# Patient Record
Sex: Male | Born: 1950 | Race: White | Hispanic: No | Marital: Married | Smoking: Former smoker
Health system: Southern US, Community
[De-identification: ages and names within clinical notes are randomized; demographics above are authoritative.]

## PROBLEM LIST (undated history)

## (undated) DIAGNOSIS — J45909 Unspecified asthma, uncomplicated: Secondary | ICD-10-CM

## (undated) DIAGNOSIS — I10 Essential (primary) hypertension: Secondary | ICD-10-CM

## (undated) DIAGNOSIS — E119 Type 2 diabetes mellitus without complications: Secondary | ICD-10-CM

## (undated) DIAGNOSIS — F419 Anxiety disorder, unspecified: Secondary | ICD-10-CM

## (undated) DIAGNOSIS — G4730 Sleep apnea, unspecified: Secondary | ICD-10-CM

## (undated) DIAGNOSIS — L409 Psoriasis, unspecified: Secondary | ICD-10-CM

## (undated) DIAGNOSIS — S0291XA Unspecified fracture of skull, initial encounter for closed fracture: Secondary | ICD-10-CM

## (undated) DIAGNOSIS — E7800 Pure hypercholesterolemia, unspecified: Secondary | ICD-10-CM

## (undated) HISTORY — PX: NOSE SURGERY: SHX723

## (undated) HISTORY — DX: Anxiety disorder, unspecified: F41.9

## (undated) HISTORY — DX: Sleep apnea, unspecified: G47.30

## (undated) HISTORY — PX: ADENOIDECTOMY: SUR15

## (undated) HISTORY — DX: Essential (primary) hypertension: I10

## (undated) HISTORY — DX: Unspecified asthma, uncomplicated: J45.909

## (undated) HISTORY — DX: Type 2 diabetes mellitus without complications: E11.9

## (undated) HISTORY — DX: Pure hypercholesterolemia, unspecified: E78.00

---

## 1968-06-13 DIAGNOSIS — S0291XA Unspecified fracture of skull, initial encounter for closed fracture: Secondary | ICD-10-CM

## 1968-06-13 HISTORY — DX: Unspecified fracture of skull, initial encounter for closed fracture: S02.91XA

## 2008-10-03 ENCOUNTER — Ambulatory Visit: Payer: Self-pay | Admitting: Unknown Physician Specialty

## 2008-10-05 ENCOUNTER — Emergency Department: Payer: Self-pay | Admitting: Emergency Medicine

## 2013-11-20 DIAGNOSIS — F411 Generalized anxiety disorder: Secondary | ICD-10-CM | POA: Insufficient documentation

## 2013-11-20 DIAGNOSIS — E349 Endocrine disorder, unspecified: Secondary | ICD-10-CM | POA: Insufficient documentation

## 2014-07-25 ENCOUNTER — Encounter: Payer: Self-pay | Admitting: Urology

## 2014-07-30 ENCOUNTER — Encounter: Payer: Self-pay | Admitting: Urology

## 2014-08-05 ENCOUNTER — Ambulatory Visit: Payer: Self-pay | Admitting: Family Medicine

## 2014-08-12 ENCOUNTER — Encounter
Admit: 2014-08-12 | Disposition: A | Discharge status: Home / Self Care | Payer: Self-pay | Attending: Urology | Admitting: Urology

## 2014-11-26 DIAGNOSIS — L409 Psoriasis, unspecified: Secondary | ICD-10-CM | POA: Insufficient documentation

## 2014-11-26 DIAGNOSIS — E669 Obesity, unspecified: Secondary | ICD-10-CM | POA: Insufficient documentation

## 2014-11-26 DIAGNOSIS — G4739 Other sleep apnea: Secondary | ICD-10-CM | POA: Insufficient documentation

## 2015-01-14 ENCOUNTER — Ambulatory Visit
Admission: RE | Admit: 2015-01-14 | Discharge: 2015-01-14 | Disposition: A | Discharge status: Home / Self Care | Payer: BC Managed Care – PPO | Source: Ambulatory Visit | Attending: Urology | Admitting: Urology

## 2015-01-14 DIAGNOSIS — D751 Secondary polycythemia: Secondary | ICD-10-CM | POA: Insufficient documentation

## 2015-01-14 LAB — CBC
HCT: 56.4 % — ABNORMAL HIGH (ref 40.0–52.0)
HEMOGLOBIN: 19.3 g/dL — AB (ref 13.0–18.0)
MCH: 31.8 pg (ref 26.0–34.0)
MCHC: 34.1 g/dL (ref 32.0–36.0)
MCV: 93 fL (ref 80.0–100.0)
PLATELETS: 206 10*3/uL (ref 150–440)
RBC: 6.07 MIL/uL — AB (ref 4.40–5.90)
RDW: 12.9 % (ref 11.5–14.5)
WBC: 7.4 10*3/uL (ref 3.8–10.6)

## 2015-07-06 DIAGNOSIS — N529 Male erectile dysfunction, unspecified: Secondary | ICD-10-CM | POA: Insufficient documentation

## 2015-07-06 DIAGNOSIS — N138 Other obstructive and reflux uropathy: Secondary | ICD-10-CM | POA: Insufficient documentation

## 2015-07-06 DIAGNOSIS — R351 Nocturia: Secondary | ICD-10-CM | POA: Insufficient documentation

## 2015-07-06 DIAGNOSIS — N401 Enlarged prostate with lower urinary tract symptoms: Secondary | ICD-10-CM

## 2017-09-27 ENCOUNTER — Encounter: Payer: Self-pay | Admitting: Urology

## 2017-09-27 ENCOUNTER — Ambulatory Visit: Payer: Medicare Other | Admitting: Urology

## 2017-09-27 VITALS — BP 164/98 | HR 90 | Resp 16 | Ht 66.0 in | Wt 208.9 lb

## 2017-09-27 DIAGNOSIS — N5201 Erectile dysfunction due to arterial insufficiency: Secondary | ICD-10-CM

## 2017-09-27 DIAGNOSIS — E291 Testicular hypofunction: Secondary | ICD-10-CM

## 2017-09-27 MED ORDER — SILDENAFIL CITRATE 20 MG PO TABS: ORAL_TABLET | ORAL | 3 refills | Status: AC

## 2017-09-27 NOTE — Progress Notes (Signed)
09/27/2017 7:10 AM   Satira Sark Nov 24, 1950 [id number]  Referring provider: Dorothey Baseman, MD [phone] [name] [place], [place] [address]  Chief Complaint  Patient presents with  . Follow-up    HPI: Roberto Christian is a 67 year old male followed for hypogonadism, erectile dysfunction and lower urinary tract symptoms.  He was previously followed by Dr. Sheppard Penton.  He underwent TUMT without improvement in his voiding symptoms.  He was interested in PVP however a video urodynamic study was performed which showed no evidence of outlet obstruction and decreased detrusor tone.  He did not have a significant PVR and his voiding symptoms actually improved.  He is using sildenafil for erectile dysfunction.  He has symptomatic hypogonadism with tiredness, fatigue and decreased libido and is injecting 200 mg every 2 weeks however he ran out of testosterone 2 months ago and has been off TRT.   PMH: Past Medical History:  Diagnosis Date  . Anxiety   . Asthma   . High cholesterol   . Hypertension   . Sleep apnea     Surgical History: Past Surgical History:  Procedure Laterality Date  . ADENOIDECTOMY    . NOSE SURGERY      Home Medications:  Allergies as of 09/27/2017   No Known Allergies     Medication List        Accurate as of 09/27/17 11:59 PM. Always use your most recent med list.          fluticasone 50 MCG/ACT nasal spray Commonly known as:  FLONASE Place into the nose.   sildenafil 20 MG tablet Commonly known as:  REVATIO 1-5 tabs 1 hour prior to intercourse   triamcinolone cream 0.1 % Commonly known as:  KENALOG Apply topically.   trospium 20 MG tablet Commonly known as:  SANCTURA Take by mouth.       Allergies: No Known Allergies  Family History: Family History  Problem Relation Age of Onset  . Cancer Mother 94  . Heart attack Father 75  . Bladder Cancer Neg Hx   . Kidney cancer Neg Hx     Social History:  has no tobacco, alcohol, and drug history on  file.  ROS: UROLOGY Frequent Urination?: No Hard to postpone urination?: No Burning/pain with urination?: No Get up at night to urinate?: Yes Leakage of urine?: No Urine stream starts and stops?: No Trouble starting stream?: No Do you have to strain to urinate?: No Blood in urine?: No Urinary tract infection?: No Sexually transmitted disease?: No Injury to kidneys or bladder?: No Painful intercourse?: No Weak stream?: No Erection problems?: Yes Penile pain?: No  Gastrointestinal Nausea?: No Vomiting?: No Indigestion/heartburn?: No Diarrhea?: No Constipation?: No  Constitutional Fever: No Night sweats?: No Weight loss?: No Fatigue?: No  Skin Skin rash/lesions?: No Itching?: No  Eyes Blurred vision?: No Double vision?: No  Ears/Nose/Throat Sore throat?: No Sinus problems?: Yes  Hematologic/Lymphatic Swollen glands?: No Easy bruising?: No  Cardiovascular Leg swelling?: No Chest pain?: No  Respiratory Cough?: No Shortness of breath?: No  Endocrine Excessive thirst?: No  Musculoskeletal Back pain?: No Joint pain?: No  Neurological Headaches?: No Dizziness?: No  Psychologic Depression?: No Anxiety?: No  Physical Exam: BP (!) 164/98   Pulse 90   Resp 16   Ht 5\' 6"  (1.676 m)   Wt 208 lb 14.4 oz (94.8 kg)   SpO2 98%   BMI 33.72 kg/m   Constitutional:  Alert and oriented, No acute distress. HEENT: [name] AT, moist mucus membranes.  Trachea midline, no masses. Cardiovascular: No clubbing, cyanosis, or edema. Respiratory: Normal respiratory effort, no increased work of breathing. GI: Abdomen is soft, nontender, nondistended, no abdominal masses GU: No CVA tenderness Lymph: No cervical or inguinal lymphadenopathy. Skin: No rashes, bruises or suspicious lesions. Neurologic: Grossly intact, no focal deficits, moving all 4 extremities. Psychiatric: Normal mood and affect.   Assessment & Plan:   67 year old male with hypogonadism and erectile  dysfunction.  Since he has been off replacement for over 2 months labs were not drawn today.  Rx testosterone was sent to his pharmacy along with a refill of sildenafil.  He will return for a lab visit and blood work in approximately 3 months and a follow-up visit here in 6 months.   Return in about 6 months (around 03/29/2018).  [name] C Addy Mcmannis, MD  [facility] [address], [address] [place], [place] [address] [phone]

## 2017-09-28 ENCOUNTER — Encounter: Payer: Self-pay | Admitting: Urology

## 2017-09-28 MED ORDER — TESTOSTERONE CYPIONATE 200 MG/ML IM SOLN: 200.0000 mg | INTRAMUSCULAR | 0 refills | Status: AC

## 2017-11-03 DIAGNOSIS — E785 Hyperlipidemia, unspecified: Secondary | ICD-10-CM | POA: Insufficient documentation

## 2017-11-24 ENCOUNTER — Encounter: Payer: Self-pay | Admitting: *Deleted

## 2017-11-24 ENCOUNTER — Encounter: Payer: Medicare Other | Attending: Family Medicine | Admitting: *Deleted

## 2017-11-24 ENCOUNTER — Ambulatory Visit: Payer: BC Managed Care – PPO | Admitting: *Deleted

## 2017-11-24 VITALS — BP 152/90 | Ht 67.0 in | Wt 208.6 lb

## 2017-11-24 DIAGNOSIS — Z6832 Body mass index (BMI) 32.0-32.9, adult: Secondary | ICD-10-CM | POA: Diagnosis not present

## 2017-11-24 DIAGNOSIS — E119 Type 2 diabetes mellitus without complications: Secondary | ICD-10-CM | POA: Diagnosis not present

## 2017-11-24 DIAGNOSIS — Z713 Dietary counseling and surveillance: Secondary | ICD-10-CM | POA: Insufficient documentation

## 2017-11-24 NOTE — Progress Notes (Signed)
Diabetes Self-Management Education  Visit Type: First/Initial  Appt. Start Time: 1005 Appt. End Time: 1100  The patient stayed until 10:40 am because he had to work. His wife stayed for meal planning instruction.   11/24/2017  [name], identified by name and date of birth, is a 67 y.o. male with a diagnosis of Diabetes: Type 2.   ASSESSMENT  Blood pressure (!) 152/90, height 5\' 7"  (1.702 m), weight 208 lb 9.6 oz (94.6 kg). Body mass index is 32.67 kg/m.  Diabetes Self-Management Education - 11/24/17 1134      Visit Information   Visit Type  First/Initial      Initial Visit   Diabetes Type  Type 2    Are you currently following a meal plan?  No    Are you taking your medications as prescribed?  Yes    Date Diagnosed  Pt reports 1 month - A1C in Jan 2018 of 6.6 %      Health Coping   How would you rate your overall health?  Fair      Psychosocial Assessment   Patient Belief/Attitude about Diabetes  Other (comment) "the same"    Self-care barriers  None    Self-management support  Doctor's office;Family    Other persons present  Spouse/SO Pt came for 40 minutes and had to leave for work. His wife was present for meal planning.     Patient Concerns  Nutrition/Meal planning;Monitoring;Medication;Healthy Lifestyle;Problem Solving;Glycemic Control;Weight Control    Special Needs  None    Preferred Learning Style  Auditory    Learning Readiness  Contemplating    How often do you need to have someone help you when you read instructions, pamphlets, or other written materials from your doctor or pharmacy?  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  Needs Instruction    Patient understands incorporating nutritional management into lifestyle.  Needs Instruction    Patient undertands incorporating physical activity into lifestyle.  Needs  Instruction    Patient understands using medications safely.  Needs Instruction    Patient understands monitoring blood glucose, interpreting and using results  Needs Instruction    Patient understands prevention, detection, and treatment of acute complications.  Needs Instruction    Patient understands prevention, detection, and treatment of chronic complications.  Needs Instruction    Patient understands how to develop strategies to address psychosocial issues.  Needs Instruction    Patient understands how to develop strategies to promote health/change behavior.  Needs Instruction      Complications   Last HgB A1C per patient/outside source  8.5 % 11/03/17    How often do you check your blood sugar?  0 times/day (not testing) Provided One Touch Verio Flex meter and instructed on use. BG upon return demonstration was 234 mg/dL at 40:9810:35 am - 3 hrs pp.     Have you had a dilated eye exam in the past 12 months?  No    Have you had a dental exam in the past 12 months?  Yes    Are you checking your feet?  No      Dietary Intake   Breakfast  eggs and sausage or bacon, oatmeal    Snack (morning)  yogurt, jello and fruit, yougrt bars    Lunch  chicken tenders, potato salad; hamburger  Snack (afternoon)  same as morning    Dinner  grilled chicken, steak, fish, potatoes, peas, corn, rice, pasta, salads, peppers, lettuce, tomatotes, broccoli    Beverage(s)  SF Gatorade      Exercise   Exercise Type  ADL's      Patient Education   Previous Diabetes Education  No    Disease state   Definition of diabetes, type 1 and 2, and the diagnosis of diabetes    Nutrition management   Role of diet in the treatment of diabetes and the relationship between the three main macronutrients and blood glucose level;Reviewed blood glucose goals for pre and post meals and how to evaluate the patients' food intake on their blood glucose level.    Physical activity and exercise   Role of exercise on diabetes management,  blood pressure control and cardiac health.    Medications  Reviewed patients medication for diabetes, action, purpose, timing of dose and side effects.    Monitoring  Taught/evaluated SMBG meter.;Purpose and frequency of SMBG.;Taught/discussed recording of test results and interpretation of SMBG.;Identified appropriate SMBG and/or A1C goals.    Chronic complications  Relationship between chronic complications and blood glucose control    Psychosocial adjustment  Identified and addressed patients feelings and concerns about diabetes      Individualized Goals (developed by patient)   Reducing Risk Improve blood sugars Decrease medications Prevent diabetes complications Lose weight Lead a healthier lifestyle Become more fit     Outcomes   Expected Outcomes  Demonstrated interest in learning. Expect positive outcomes    Future DMSE  4-6 wks       Individualized Plan for Diabetes Self-Management Training:   Learning Objective:  Patient will have a greater understanding of diabetes self-management. Patient education plan is to attend individual and/or group sessions per assessed needs and concerns.   Plan:   Patient Instructions  Check blood sugars 2 x day before breakfast and 2 hrs after supper every day Bring blood sugar records to the next class Call your doctor for a prescription for:  1. Meter strips (type) One Touch Verio checking  2 times per day  2. Lancets (type) One Touch Delica checking  2     times per day  Exercise: Begin walking  for   15  minutes  3 days a week and gradually increase to 30 minutes 5 x week Eat 3 meals day,  1-2  snacks a day Space meals 4-6 hours apart Make an eye doctor appointment  Expected Outcomes:  Demonstrated interest in learning. Expect positive outcomes  Education material provided:  General Meal Planning Guidelines Simple Meal Plan Meter - One Touch Verio Flex  If problems or questions, patient to contact team via:  Sharion Settler,  RN, CCM, CDE [phone]  Future DSME appointment: 4-6 wks  December 21, 2017 for Diabetes Class 1

## 2017-11-24 NOTE — Patient Instructions (Signed)
Check blood sugars 2 x day before breakfast and 2 hrs after supper every day Bring blood sugar records to the next class  Call your doctor for a prescription for:  1. Meter strips (type) One Touch Verio checking  2 times per day  2. Lancets (type) One Touch Delica checking  2     times per day  Exercise: Begin walking  for   15  minutes  3 days a week and gradually increase to 30 minutes 5 x week  Eat 3 meals day,  1-2  snacks a day Space meals 4-6 hours apart  Make an eye doctor appointment  Return for classes on:

## 2017-12-06 ENCOUNTER — Other Ambulatory Visit: Payer: Self-pay | Admitting: Sports Medicine

## 2017-12-06 DIAGNOSIS — M25511 Pain in right shoulder: Principal | ICD-10-CM

## 2017-12-06 DIAGNOSIS — G8929 Other chronic pain: Secondary | ICD-10-CM

## 2017-12-06 DIAGNOSIS — M75101 Unspecified rotator cuff tear or rupture of right shoulder, not specified as traumatic: Secondary | ICD-10-CM

## 2017-12-06 DIAGNOSIS — R29898 Other symptoms and signs involving the musculoskeletal system: Secondary | ICD-10-CM

## 2017-12-15 ENCOUNTER — Ambulatory Visit
Admission: RE | Admit: 2017-12-15 | Discharge: 2017-12-15 | Disposition: A | Discharge status: Home / Self Care | Payer: Medicare Other | Source: Ambulatory Visit | Attending: Sports Medicine | Admitting: Sports Medicine

## 2017-12-15 DIAGNOSIS — M75101 Unspecified rotator cuff tear or rupture of right shoulder, not specified as traumatic: Secondary | ICD-10-CM

## 2017-12-15 DIAGNOSIS — M75121 Complete rotator cuff tear or rupture of right shoulder, not specified as traumatic: Secondary | ICD-10-CM | POA: Insufficient documentation

## 2017-12-15 DIAGNOSIS — M25511 Pain in right shoulder: Secondary | ICD-10-CM | POA: Diagnosis present

## 2017-12-15 DIAGNOSIS — R29898 Other symptoms and signs involving the musculoskeletal system: Secondary | ICD-10-CM | POA: Insufficient documentation

## 2017-12-15 DIAGNOSIS — G8929 Other chronic pain: Secondary | ICD-10-CM | POA: Insufficient documentation

## 2017-12-21 ENCOUNTER — Encounter: Payer: Self-pay | Admitting: Dietician

## 2017-12-21 ENCOUNTER — Encounter: Payer: Medicare Other | Attending: Family Medicine | Admitting: Dietician

## 2017-12-21 VITALS — Ht 67.0 in | Wt 206.7 lb

## 2017-12-21 DIAGNOSIS — E119 Type 2 diabetes mellitus without complications: Secondary | ICD-10-CM | POA: Insufficient documentation

## 2017-12-21 DIAGNOSIS — Z713 Dietary counseling and surveillance: Secondary | ICD-10-CM | POA: Insufficient documentation

## 2017-12-21 DIAGNOSIS — Z6832 Body mass index (BMI) 32.0-32.9, adult: Secondary | ICD-10-CM | POA: Insufficient documentation

## 2017-12-21 NOTE — Progress Notes (Signed)

## 2017-12-27 ENCOUNTER — Other Ambulatory Visit: Payer: Self-pay

## 2017-12-27 ENCOUNTER — Encounter
Admission: RE | Admit: 2017-12-27 | Discharge: 2017-12-27 | Disposition: A | Discharge status: Home / Self Care | Payer: Medicare Other | Source: Ambulatory Visit | Attending: Orthopedic Surgery | Admitting: Orthopedic Surgery

## 2017-12-27 ENCOUNTER — Other Ambulatory Visit: Payer: Medicare Other

## 2017-12-27 DIAGNOSIS — Z01812 Encounter for preprocedural laboratory examination: Secondary | ICD-10-CM | POA: Diagnosis present

## 2017-12-27 DIAGNOSIS — E119 Type 2 diabetes mellitus without complications: Secondary | ICD-10-CM | POA: Diagnosis not present

## 2017-12-27 DIAGNOSIS — I10 Essential (primary) hypertension: Secondary | ICD-10-CM | POA: Insufficient documentation

## 2017-12-27 DIAGNOSIS — Z01810 Encounter for preprocedural cardiovascular examination: Secondary | ICD-10-CM | POA: Insufficient documentation

## 2017-12-27 DIAGNOSIS — E291 Testicular hypofunction: Secondary | ICD-10-CM

## 2017-12-27 HISTORY — DX: Unspecified fracture of skull, initial encounter for closed fracture: S02.91XA

## 2017-12-27 HISTORY — DX: Psoriasis, unspecified: L40.9

## 2017-12-27 LAB — BASIC METABOLIC PANEL
ANION GAP: 9 (ref 5–15)
BUN: 14 mg/dL (ref 8–23)
CO2: 25 mmol/L (ref 22–32)
Calcium: 9.7 mg/dL (ref 8.9–10.3)
Chloride: 105 mmol/L (ref 98–111)
Creatinine, Ser: 0.71 mg/dL (ref 0.61–1.24)
GFR calc Af Amer: >60 mL/min (ref >60)
GFR calc non Af Amer: >60 mL/min (ref >60)
Glucose, Bld: 125 mg/dL — ABNORMAL HIGH (ref 70–99)
POTASSIUM: 3.9 mmol/L (ref 3.5–5.1)
Sodium: 139 mmol/L (ref 135–145)

## 2017-12-27 LAB — CBC
HEMATOCRIT: 53.4 % — AB (ref 40.0–52.0)
Hemoglobin: 18.7 g/dL — ABNORMAL HIGH (ref 13.0–18.0)
MCH: 32.9 pg (ref 26.0–34.0)
MCHC: 35.1 g/dL (ref 32.0–36.0)
MCV: 93.7 fL (ref 80.0–100.0)
Platelets: 223 10*3/uL (ref 150–440)
RBC: 5.7 MIL/uL (ref 4.40–5.90)
RDW: 13 % (ref 11.5–14.5)
WBC: 8.4 10*3/uL (ref 3.8–10.6)

## 2017-12-27 NOTE — Patient Instructions (Signed)
Your procedure is scheduled on: January 05, 2018 FRIDAY [facility] To find out your arrival time, please call [phone] between 1PM - 3PM on: January 04, 2018 Thursday   REMEMBER: Instructions that are not followed completely may result in serious medical risk, up to and including death; or upon the discretion of your surgeon and anesthesiologist your surgery may need to be rescheduled.  Do not eat food after midnight the night before your procedure.  No gum chewing, lozengers or hard candies.  You may however, drink CLEAR liquids up to 2 hours before you are scheduled to arrive for your surgery. Do not drink anything within 2 hours of the start of your surgery.  Clear liquids include: - water   Do NOT drink anything that is not on this list.  Type 1 and Type 2 diabetics should only drink water.  No Alcohol for 24 hours before or after surgery.  No Smoking including e-cigarettes for 24 hours prior to surgery.  No chewable tobacco products for at least 6 hours prior to surgery.  No nicotine patches on the day of surgery.  On the morning of surgery brush your teeth with toothpaste and water, you may rinse your mouth with mouthwash if you wish. Do not swallow any toothpaste or mouthwash.  Notify your doctor if there is any change in your medical condition (cold, fever, infection).  Do not wear jewelry, make-up, hairpins, clips or nail polish.  Do not wear lotions, powders, or perfumes. You may NOTwear deodorant.  Do not shave 48 hours prior to surgery. Men may shave face and neck.  Contacts and dentures may not be worn into surgery.  Do not bring valuables to the hospital, including drivers license, insurance or credit cards.  [facility] is not responsible for any belongings or valuables.   TAKE THESE MEDICATIONS THE MORNING OF SURGERY:  Use CHG Soap or wipes as directed on instruction sheet.  Bring your C-PAP to the hospital  with you in case you may have to spend the night.   Stop Metformin  2 days prior to surgery. LAST DOSE January 02, 2018.  Follow recommendations from Cardiologist, Pulmonologist or PCP regarding stopping Aspirin, Coumadin, Plavix, Eliquis, Pradaxa, or Pletal.  Stop Anti-inflammatories (NSAIDS) such as Advil, Aleve, Ibuprofen, Motrin, Naproxen, Naprosyn and Aspirin based products such as Excedrin, Goodys Powder, BC Powder. (May take Tylenol or Acetaminophen if needed.)  Stop ANY OVER THE COUNTER  MELATONIN  AND supplements until after surgery. (May continue Vitamin D, Vitamin B, and multivitamin.)  Wear comfortable clothing (specific to your surgery type) to the hospital.  Plan for stool softeners for home use.  If you are being admitted to the hospital overnight, leave your suitcase in the car. After surgery it may be brought to your room.  If you are being discharged the day of surgery, you will not be allowed to drive home. You will need a responsible adult to drive you home and stay with you that night.   If you are taking public transportation, you will need to have a responsible adult with you. Please confirm with your physician that it is acceptable to use public transportation.   Please call [phone] if you have any questions about these instructions.

## 2017-12-27 NOTE — Pre-Procedure Instructions (Signed)
Per Dr. Randa NgoPiscitello (Anesthesia), pt needs medical clearance for abnormal EKG.  Clearance request form & EKG faxed to PCP, cc'ed Dr. Stephenie AcresPatel FYI.

## 2017-12-28 ENCOUNTER — Telehealth: Payer: Self-pay | Admitting: *Deleted

## 2017-12-28 ENCOUNTER — Ambulatory Visit: Payer: Medicare Other

## 2017-12-28 LAB — HEMOGLOBIN: HEMOGLOBIN: 19.1 g/dL — AB (ref 13.0–17.7)

## 2017-12-28 LAB — PSA: Prostate Specific Ag, Serum: 1 ng/mL (ref 0.0–4.0)

## 2017-12-28 LAB — TESTOSTERONE: Testosterone: 989 ng/dL — ABNORMAL HIGH (ref 264–916)

## 2017-12-28 NOTE — Telephone Encounter (Signed)
Pt didn't show for Diabetes Class 2 today. Called and left message to come to next week's Class 3 and we can reschedule Class 2 at that time.

## 2017-12-29 ENCOUNTER — Telehealth: Payer: Self-pay

## 2017-12-29 NOTE — Telephone Encounter (Signed)
Pt informed, he will go donate blood, will return for appointment in October.

## 2017-12-29 NOTE — Telephone Encounter (Signed)
-----   Message from [name] C Stoioff, MD sent at 12/28/2017  7:56 AM EDT ----- Testosterone level was 989.  His hemoglobin was elevated and would recommend he donate blood if he is able to do.  PSA was normal but higher than baseline.  Will recheck at his October visit.

## 2018-01-01 NOTE — Pre-Procedure Instructions (Signed)
CLEARANCE BY DR Terance HartBRONSTEIN ON CHART

## 2018-01-04 ENCOUNTER — Encounter: Payer: Medicare Other | Admitting: Dietician

## 2018-01-04 ENCOUNTER — Encounter: Payer: Self-pay | Admitting: Dietician

## 2018-01-04 VITALS — BP 158/90 | Ht 67.0 in | Wt 203.1 lb

## 2018-01-04 DIAGNOSIS — E119 Type 2 diabetes mellitus without complications: Secondary | ICD-10-CM

## 2018-01-04 DIAGNOSIS — Z713 Dietary counseling and surveillance: Secondary | ICD-10-CM | POA: Diagnosis not present

## 2018-01-04 MED ORDER — CEFAZOLIN SODIUM-DEXTROSE 2-4 GM/100ML-% IV SOLN
2.0000 g | Freq: Once | INTRAVENOUS | Status: AC
  Administered 2018-01-05 (×2): 2 g via INTRAVENOUS

## 2018-01-04 NOTE — Progress Notes (Signed)

## 2018-01-05 ENCOUNTER — Other Ambulatory Visit: Payer: Self-pay

## 2018-01-05 ENCOUNTER — Encounter
Admission: RE | Disposition: A | Discharge status: Home / Self Care | Payer: Self-pay | Source: Ambulatory Visit | Attending: Orthopedic Surgery

## 2018-01-05 ENCOUNTER — Encounter: Payer: Self-pay | Admitting: *Deleted

## 2018-01-05 ENCOUNTER — Ambulatory Visit: Payer: Medicare Other | Admitting: Anesthesiology

## 2018-01-05 ENCOUNTER — Ambulatory Visit
Admission: RE | Admit: 2018-01-05 | Discharge: 2018-01-05 | Disposition: A | Discharge status: Home / Self Care | Payer: Medicare Other | Source: Ambulatory Visit | Attending: Orthopedic Surgery | Admitting: Orthopedic Surgery

## 2018-01-05 DIAGNOSIS — I10 Essential (primary) hypertension: Secondary | ICD-10-CM | POA: Diagnosis not present

## 2018-01-05 DIAGNOSIS — Z7984 Long term (current) use of oral hypoglycemic drugs: Secondary | ICD-10-CM | POA: Diagnosis not present

## 2018-01-05 DIAGNOSIS — Z87891 Personal history of nicotine dependence: Secondary | ICD-10-CM | POA: Insufficient documentation

## 2018-01-05 DIAGNOSIS — M75101 Unspecified rotator cuff tear or rupture of right shoulder, not specified as traumatic: Secondary | ICD-10-CM | POA: Insufficient documentation

## 2018-01-05 DIAGNOSIS — M7541 Impingement syndrome of right shoulder: Secondary | ICD-10-CM | POA: Diagnosis not present

## 2018-01-05 DIAGNOSIS — F419 Anxiety disorder, unspecified: Secondary | ICD-10-CM | POA: Insufficient documentation

## 2018-01-05 DIAGNOSIS — N400 Enlarged prostate without lower urinary tract symptoms: Secondary | ICD-10-CM | POA: Insufficient documentation

## 2018-01-05 DIAGNOSIS — M19011 Primary osteoarthritis, right shoulder: Secondary | ICD-10-CM | POA: Diagnosis not present

## 2018-01-05 DIAGNOSIS — M7521 Bicipital tendinitis, right shoulder: Secondary | ICD-10-CM | POA: Insufficient documentation

## 2018-01-05 DIAGNOSIS — Z79899 Other long term (current) drug therapy: Secondary | ICD-10-CM | POA: Diagnosis not present

## 2018-01-05 DIAGNOSIS — E119 Type 2 diabetes mellitus without complications: Secondary | ICD-10-CM | POA: Diagnosis not present

## 2018-01-05 HISTORY — PX: BICEPT TENODESIS: SHX5116

## 2018-01-05 HISTORY — PX: SHOULDER ARTHROSCOPY WITH OPEN ROTATOR CUFF REPAIR: SHX6092

## 2018-01-05 LAB — GLUCOSE, CAPILLARY
Glucose-Capillary: 127 mg/dL — ABNORMAL HIGH (ref 70–99)
Glucose-Capillary: 181 mg/dL — ABNORMAL HIGH (ref 70–99)

## 2018-01-05 SURGERY — ARTHROSCOPY, SHOULDER WITH REPAIR, ROTATOR CUFF, OPEN
Anesthesia: General | Laterality: Right

## 2018-01-05 MED ORDER — OXYCODONE HCL 5 MG/5ML PO SOLN: 5.0000 mg | Freq: Once | ORAL | Status: AC | PRN

## 2018-01-05 MED ORDER — BUPIVACAINE HCL (PF) 0.5 % IJ SOLN
INTRAMUSCULAR | Status: AC
  Filled 2018-01-05: qty 10

## 2018-01-05 MED ORDER — LIDOCAINE HCL (PF) 1 % IJ SOLN
INTRAMUSCULAR | Status: AC
  Filled 2018-01-05: qty 5

## 2018-01-05 MED ORDER — FENTANYL CITRATE (PF) 100 MCG/2ML IJ SOLN
INTRAMUSCULAR | Status: DC | PRN
  Administered 2018-01-05 (×2): 50 ug via INTRAVENOUS

## 2018-01-05 MED ORDER — SODIUM CHLORIDE 0.9 % IV SOLN
INTRAVENOUS | Status: DC
  Administered 2018-01-05 (×2): via INTRAVENOUS

## 2018-01-05 MED ORDER — FENTANYL CITRATE (PF) 100 MCG/2ML IJ SOLN
INTRAMUSCULAR | Status: AC
  Filled 2018-01-05: qty 2

## 2018-01-05 MED ORDER — MIDAZOLAM HCL 2 MG/2ML IJ SOLN
1.0000 mg | Freq: Once | INTRAMUSCULAR | Status: AC
  Administered 2018-01-05: 1 mg via INTRAVENOUS

## 2018-01-05 MED ORDER — MIDAZOLAM HCL 2 MG/2ML IJ SOLN
INTRAMUSCULAR | Status: AC
  Administered 2018-01-05: 1 mg via INTRAVENOUS
  Filled 2018-01-05: qty 2

## 2018-01-05 MED ORDER — OXYCODONE HCL 5 MG PO TABS
5.0000 mg | ORAL_TABLET | Freq: Once | ORAL | Status: AC | PRN
  Administered 2018-01-05: 5 mg via ORAL

## 2018-01-05 MED ORDER — OXYCODONE HCL 5 MG PO TABS: 5.0000 mg | ORAL_TABLET | ORAL | 0 refills | Status: AC | PRN

## 2018-01-05 MED ORDER — EPINEPHRINE 30 MG/30ML IJ SOLN
INTRAMUSCULAR | Status: AC
  Filled 2018-01-05: qty 1

## 2018-01-05 MED ORDER — BUPIVACAINE LIPOSOME 1.3 % IJ SUSP
INTRAMUSCULAR | Status: AC
  Filled 2018-01-05: qty 20

## 2018-01-05 MED ORDER — ACETAMINOPHEN 500 MG PO TABS: 1000.0000 mg | ORAL_TABLET | Freq: Three times a day (TID) | ORAL | 2 refills | Status: AC

## 2018-01-05 MED ORDER — MIDAZOLAM HCL 2 MG/2ML IJ SOLN
INTRAMUSCULAR | Status: DC | PRN
  Administered 2018-01-05: 2 mg via INTRAVENOUS

## 2018-01-05 MED ORDER — EPHEDRINE SULFATE 50 MG/ML IJ SOLN
INTRAMUSCULAR | Status: DC | PRN
  Administered 2018-01-05: 10 mg via INTRAVENOUS

## 2018-01-05 MED ORDER — FENTANYL CITRATE (PF) 100 MCG/2ML IJ SOLN: 25.0000 ug | INTRAMUSCULAR | Status: DC | PRN

## 2018-01-05 MED ORDER — OXYCODONE HCL 5 MG PO TABS
ORAL_TABLET | ORAL | Status: AC
  Filled 2018-01-05: qty 1

## 2018-01-05 MED ORDER — BUPIVACAINE HCL (PF) 0.5 % IJ SOLN
INTRAMUSCULAR | Status: DC | PRN
  Administered 2018-01-05: 10 mL via PERINEURAL

## 2018-01-05 MED ORDER — FENTANYL CITRATE (PF) 100 MCG/2ML IJ SOLN
50.0000 ug | Freq: Once | INTRAMUSCULAR | Status: AC
  Administered 2018-01-05: 50 ug via INTRAVENOUS

## 2018-01-05 MED ORDER — PROMETHAZINE HCL 25 MG/ML IJ SOLN: 6.2500 mg | INTRAMUSCULAR | Status: DC | PRN

## 2018-01-05 MED ORDER — FENTANYL CITRATE (PF) 100 MCG/2ML IJ SOLN
INTRAMUSCULAR | Status: AC
  Administered 2018-01-05: 50 ug via INTRAVENOUS
  Filled 2018-01-05: qty 2

## 2018-01-05 MED ORDER — LIDOCAINE-EPINEPHRINE 1 %-1:100000 IJ SOLN
INTRAMUSCULAR | Status: DC | PRN
  Administered 2018-01-05: 7 mL

## 2018-01-05 MED ORDER — ONDANSETRON HCL 4 MG/2ML IJ SOLN
INTRAMUSCULAR | Status: DC | PRN
  Administered 2018-01-05: 4 mg via INTRAVENOUS

## 2018-01-05 MED ORDER — LIDOCAINE-EPINEPHRINE 1 %-1:100000 IJ SOLN
INTRAMUSCULAR | Status: AC
  Filled 2018-01-05: qty 1

## 2018-01-05 MED ORDER — ASPIRIN EC 325 MG PO TBEC: 325.0000 mg | DELAYED_RELEASE_TABLET | Freq: Every day | ORAL | 0 refills | Status: AC

## 2018-01-05 MED ORDER — MEPERIDINE HCL 50 MG/ML IJ SOLN: 6.2500 mg | INTRAMUSCULAR | Status: DC | PRN

## 2018-01-05 MED ORDER — FAMOTIDINE 20 MG PO TABS
20.0000 mg | ORAL_TABLET | Freq: Once | ORAL | Status: AC
  Administered 2018-01-05: 20 mg via ORAL

## 2018-01-05 MED ORDER — PROPOFOL 10 MG/ML IV BOLUS
INTRAVENOUS | Status: DC | PRN
  Administered 2018-01-05: 30 mg via INTRAVENOUS
  Administered 2018-01-05: 160 mg via INTRAVENOUS
  Administered 2018-01-05: 30 mg via INTRAVENOUS

## 2018-01-05 MED ORDER — LIDOCAINE HCL (PF) 1 % IJ SOLN
INTRAMUSCULAR | Status: DC | PRN
  Administered 2018-01-05: 3 mL

## 2018-01-05 MED ORDER — ROCURONIUM BROMIDE 100 MG/10ML IV SOLN
INTRAVENOUS | Status: DC | PRN
  Administered 2018-01-05: 20 mg via INTRAVENOUS
  Administered 2018-01-05: 50 mg via INTRAVENOUS

## 2018-01-05 MED ORDER — PHENYLEPHRINE HCL 10 MG/ML IJ SOLN
INTRAMUSCULAR | Status: DC | PRN
  Administered 2018-01-05: 100 ug via INTRAVENOUS

## 2018-01-05 MED ORDER — DEXAMETHASONE SODIUM PHOSPHATE 10 MG/ML IJ SOLN
INTRAMUSCULAR | Status: DC | PRN
  Administered 2018-01-05: 10 mg via INTRAVENOUS

## 2018-01-05 MED ORDER — BUPIVACAINE LIPOSOME 1.3 % IJ SUSP
INTRAMUSCULAR | Status: DC | PRN
  Administered 2018-01-05: 20 mL via PERINEURAL

## 2018-01-05 MED ORDER — ONDANSETRON 4 MG PO TBDP: 4.0000 mg | ORAL_TABLET | Freq: Three times a day (TID) | ORAL | 0 refills | Status: AC | PRN

## 2018-01-05 SURGICAL SUPPLY — 97 items
5.0 Barrel Burr ×2 IMPLANT
ADAPTER IRRIG TUBE 2 SPIKE SOL (ADAPTER) IMPLANT
ANCHOR 2.3 SP SGL 1.2 XBRAID (Anchor) ×4 IMPLANT
ANCHOR SUT BIO SW 4.75X19.1 (Anchor) ×8 IMPLANT
ANCHOR SUT FBRTK SUTURETAP 1.3 (Anchor) ×2 IMPLANT
BIT DRILL RIGD1.8MM FBRTK STRL (DRILL) ×1 IMPLANT
BLADE OSCILLATING/SAGITTAL (BLADE)
BLADE SW THK.38XMED LNG THN (BLADE) IMPLANT
BUR BR 5.5 12 FLUTE (BURR) IMPLANT
BUR RADIUS 4.0X18.5 (BURR) IMPLANT
BURR OVAL 8 FLU 5.0X13 (MISCELLANEOUS) ×2 IMPLANT
CANNULA 5.75X7 CRYSTAL CLEAR (CANNULA) ×2 IMPLANT
CANNULA PART THRD DISP 5.75X7 (CANNULA) ×2 IMPLANT
CANNULA PARTIAL THREAD 2X7 (CANNULA) IMPLANT
CANNULA TWIST IN 8.25X9CM (CANNULA) IMPLANT
CHLORAPREP W/TINT 26ML (MISCELLANEOUS) ×2 IMPLANT
COOLER POLAR GLACIER W/PUMP (MISCELLANEOUS) ×2 IMPLANT
COVER LIGHT HANDLE STERIS (MISCELLANEOUS) ×2 IMPLANT
CRADLE LAMINECT ARM (MISCELLANEOUS) ×4 IMPLANT
CUTTER BONE 4.0MM X 13CM (MISCELLANEOUS) ×2 IMPLANT
DERMABOND ADVANCED (GAUZE/BANDAGES/DRESSINGS) ×1
DERMABOND ADVANCED .7 DNX12 (GAUZE/BANDAGES/DRESSINGS) ×1 IMPLANT
DRAPE IMP U-DRAPE 54X76 (DRAPES) ×4 IMPLANT
DRAPE INCISE IOBAN 66X45 STRL (DRAPES) ×2 IMPLANT
DRAPE SHEET LG 3/4 BI-LAMINATE (DRAPES) ×2 IMPLANT
DRAPE STERI 35X30 U-POUCH (DRAPES) ×2 IMPLANT
DRAPE U-SHAPE 47X51 STRL (DRAPES) ×2 IMPLANT
DRILL RIGID 1.8MM FBRTK STRL (DRILL) ×2
DRSG TEGADERM 4X4.75 (GAUZE/BANDAGES/DRESSINGS) ×6 IMPLANT
DW OUTFLOW CASSETTE/TUBE SET (MISCELLANEOUS) ×2 IMPLANT
ELECT CAUTERY BLADE TIP 2.5 (TIP) ×2
ELECT REM PT RETURN 9FT ADLT (ELECTROSURGICAL) ×2
ELECTRODE CAUTERY BLDE TIP 2.5 (TIP) ×1 IMPLANT
ELECTRODE REM PT RTRN 9FT ADLT (ELECTROSURGICAL) ×1 IMPLANT
GAUZE PETRO XEROFOAM 1X8 (MISCELLANEOUS) ×2 IMPLANT
GAUZE SPONGE 4X4 12PLY STRL (GAUZE/BANDAGES/DRESSINGS) ×2 IMPLANT
GLOVE BIOGEL PI IND STRL 8 (GLOVE) ×1 IMPLANT
GLOVE BIOGEL PI INDICATOR 8 (GLOVE) ×1
GLOVE SURG SYN 8.0 (GLOVE) ×2 IMPLANT
GOWN STRL REUS W/ TWL LRG LVL3 (GOWN DISPOSABLE) ×1 IMPLANT
GOWN STRL REUS W/TWL LRG LVL3 (GOWN DISPOSABLE) ×1
GOWN STRL REUS W/TWL LRG LVL4 (GOWN DISPOSABLE) ×2 IMPLANT
IV LACTATED RINGER IRRG 3000ML (IV SOLUTION) ×8
IV LR IRRIG 3000ML ARTHROMATIC (IV SOLUTION) ×8 IMPLANT
KIT SPEAR STR 1.6MM DRILL (MISCELLANEOUS) ×2 IMPLANT
KIT STABILIZATION SHOULDER (MISCELLANEOUS) ×2 IMPLANT
KIT SUTURETAK 3.0 INSERT PERC (KITS) ×2 IMPLANT
KIT TURNOVER KIT A (KITS) ×2 IMPLANT
MANIFOLD NEPTUNE II (INSTRUMENTS) ×2 IMPLANT
MASK FACE SPIDER DISP (MASK) ×2 IMPLANT
MAT ABSORB  FLUID 56X50 GRAY (MISCELLANEOUS) ×2
MAT ABSORB FLUID 56X50 GRAY (MISCELLANEOUS) ×2 IMPLANT
NDL SAFETY ECLIPSE 18X1.5 (NEEDLE) ×1 IMPLANT
NEEDLE HYPO 18GX1.5 SHARP (NEEDLE) ×1
NEEDLE HYPO 22GX1.5 SAFETY (NEEDLE) ×2 IMPLANT
NEEDLE MAYO 6 CRC TAPER PT (NEEDLE) IMPLANT
NEEDLE SCORPION MULTI FIRE (NEEDLE) ×2 IMPLANT
PACK ARTHROSCOPY SHOULDER (MISCELLANEOUS) ×2 IMPLANT
PAD ABD DERMACEA PRESS 5X9 (GAUZE/BANDAGES/DRESSINGS) ×2 IMPLANT
PAD WRAPON POLAR SHDR XLG (MISCELLANEOUS) ×1 IMPLANT
PORT APPOLLO RF 90DEGREE MULTI (SURGICAL WAND) ×2 IMPLANT
PROBE APOLLO 90XL (SURGICAL WAND) ×2 IMPLANT
SET ADAPTER IRRIG ARTHO 72IN Y (ADAPTER) ×2 IMPLANT
SET TUBE SUCT SHAVER OUTFL 24K (TUBING) IMPLANT
SET TUBE TIP INTRA-ARTICULAR (MISCELLANEOUS) IMPLANT
SLING ULTRA II M (MISCELLANEOUS) ×2 IMPLANT
SPONGE LAP 18X18 RF (DISPOSABLE) ×2 IMPLANT
STAPLER SKIN PROX 35W (STAPLE) IMPLANT
STRAP SAFETY 5IN WIDE (MISCELLANEOUS) ×2 IMPLANT
STRIP CLOSURE SKIN 1/2X4 (GAUZE/BANDAGES/DRESSINGS) IMPLANT
SUT ETHILON 3-0 (SUTURE) IMPLANT
SUT ETHILON 3-0 FS-10 30 BLK (SUTURE) ×2
SUT ETHILON 4-0 (SUTURE) ×1
SUT ETHILON 4-0 FS2 18XMFL BLK (SUTURE) ×1
SUT LASSO 90 DEG SD STR (SUTURE) IMPLANT
SUT MNCRL 4-0 (SUTURE)
SUT MNCRL 4-0 27XMFL (SUTURE)
SUT PROLENE 0 CT 2 (SUTURE) IMPLANT
SUT TICRON 2-0 30IN 311381 (SUTURE) IMPLANT
SUT TIGER TAPE 7 IN WHITE (SUTURE) ×2 IMPLANT
SUT VIC AB 0 CT1 36 (SUTURE) IMPLANT
SUT VIC AB 2-0 CT2 27 (SUTURE) IMPLANT
SUT VICRYL 3-0 27IN (SUTURE) IMPLANT
SUTURE EHLN 3-0 FS-10 30 BLK (SUTURE) ×1 IMPLANT
SUTURE ETHLN 4-0 FS2 18XMF BLK (SUTURE) ×1 IMPLANT
SUTURE MNCRL 4-0 27XMF (SUTURE) IMPLANT
SUTURE TAPE 1.3 40 TPR END (SUTURE) ×1 IMPLANT
SUTURETAPE 1.3 40 TPR END (SUTURE) ×2
SYR 10ML LL (SYRINGE) ×2 IMPLANT
TAPE CLOTH 3X10 WHT NS LF (GAUZE/BANDAGES/DRESSINGS) ×2 IMPLANT
TAPE MICROFOAM 4IN (TAPE) ×2 IMPLANT
TUBING ARTHRO INFLOW-ONLY STRL (TUBING) IMPLANT
TUBING CONNECTING 10 (TUBING) ×2 IMPLANT
TUBING REDEUCE PAT W/CON 8IN (MISCELLANEOUS) ×2 IMPLANT
TUBING REDEUCE PUMP W/CON 8IN (MISCELLANEOUS) ×2 IMPLANT
WAND HAND CNTRL MULTIVAC 90 (MISCELLANEOUS) IMPLANT
WRAPON POLAR PAD SHDR XLG (MISCELLANEOUS) ×2

## 2018-01-05 NOTE — Op Note (Addendum)
SURGERY DATE: 01/05/2018  PRE-OP DIAGNOSIS:  1. Right subacromial impingement 2. Right biceps tendinopathy 3. Right rotator cuff tear 4. Right acromioclavicular joint osteoarthritis  POST-OP DIAGNOSIS: 1. Right subacromial impingement 2. Right biceps tendinopathy 3. Right rotator cuff tear (supraspinatus and subscapularis) 4. Right acromioclavicular joint osteoarthritis  PROCEDURES:  1. Right arthroscopic rotator cuff repair (subscapularis) 2. Right mini-open rotator cuff repair (supraspinatus) 3. Right open biceps tenodesis 4. Right arthroscopic distal clavicle excision 5. Right arthroscopic extensive debridement of shoulder (glenohumeral and subacromial spaces) 6. Right arthroscopic subacromial decompression  SURGEON: Rosealee AlbeeSunny H. Cameron Katayama, MD  ANESTHESIA: Gen with Exparil interscalene block  ESTIMATED BLOOD LOSS: 50cc  DRAINS:  none  TOTAL IV FLUIDS: per anesthesia   SPECIMENS: none  IMPLANTS:  - Arthrex 4.6775mm SwiveLock x4 - Arthrex 1.308mm FiberTak - [employer]ix SPEED double loaded with 1.2 and 2.30mm tape x 2  OPERATIVE FINDINGS:  Examination under anesthesia: A careful examination under anesthesia was performed.  Passive range of motion was: FF: 160; ER at side: 45; ER in abduction: 90; IR in abduction: 40.  Anterior load shift: NT.  Posterior load shift: NT.  Sulcus in neutral: NT.  Sulcus in ER: NT.    Intra-operative findings: A thorough arthroscopic examination of the shoulder was performed.  The findings are: 1. Biceps tendon: tendinopathy with significant fraying 2. Superior labrum: injected with surrounding synovitis 3. Posterior labrum and capsule: normal 4. Inferior capsule and inferior recess: normal 5. Glenoid cartilage surface: normal 6. Supraspinatus attachment: full-thickness tear 7. Posterior rotator cuff attachment: normal 8. Humeral head articular cartilage: normal 9. Rotator interval: significant synovitis 10: Subscapularis tendon: partial tear of superior  fibers 11. Anterior labrum: degenerative 12. IGHL: normal  OPERATIVE REPORT:   Indications for procedure: Roberto Christian Jentz is a 67 y.o. year old male with over 1 year of shoulder pain that recently worsened after a traumatic injury where he felt a tearing sensation in his shoulder. Clinical exam and MRI were suggestive of a full thickness rotator cuff tear without muscle atrophy, biceps tendinopathy, subacromial impingement, and acromioclavicular joint arthritis.  After discussion of risks, benefits, and alternatives to surgery, the patient elected to proceed.   Procedure in detail:  I identified Roberto Christian Demery in the pre-operative holding area.  I marked the operative shoulder with my initials. I reviewed the risks and benefits of the proposed surgical intervention, and the patient (and/or patient's guardian) wished to proceed.  Anesthesia was then performed with an Exparil interscalene block.  The patient was transferred to the operative suite and placed in the beach chair position.    SCDs were placed on the lower extremities. Appropriate IV antibiotics were administered prior to incision. The operative upper extremity was then prepped and draped in standard fashion. A time out was performed confirming the correct extremity, correct patient, and correct procedure.   I then created a standard posterior portal with an 11 blade. The glenohumeral joint was easily entered with a blunt trochar and the arthroscope introduced. The findings of diagnostic arthroscopy are described above. I debrided degenerative tissue including the synovitic tissue about the rotator interval and anterior and superior labrum. I then coagulated the inflamed synovium to obtain hemostasis and reduce the risk of post-operative swelling using an Arthrocare radiofrequency device. I performed a biceps tenotomy using an arthroscopic scissors and used a motorized shaver to debride the stump back to a stable base.   The subscapularis repair  was performed next. An accessory anterolateral portal was made. The lesser tuberosity was cleared  of soft tissue and roughened to create a bony healing bed.  A birdbeak was used to pass a 1.37mm SutureTape through the subscapularis in a horizontal mattress fashion. The limbs of the tape were loaded onto an Arthrex 4.73mm SwiveLock and inserted into the lesser tuberosity. This achieved appropriate reduction of the subscapularis.   Next, the arthroscope was then introduced into the subacromial space. A direct lateral portal was created with an 11-blade after spinal needle localization. An extensive subacromial bursectomy was performed using a combination of the shaver and Arthrocare wand. The entire acromial undersurface was exposed and the CA ligament was subperiosteally elevated to expose the anterior acromial hook. A 5.85mm barrel burr was used to create a flat anterior and lateral aspect of the acromion, converting it from a Type 2 to a Type 1 acromion. Care was made to keep the deltoid fascia intact.  I then turned my attention to the arthroscopic distal clavicle excision. I identified the acromioclavicular joint. Surrounding bursal tissue was debrided and the edges of the joint were identified. I used the 5.51mm barrel burr to remove the distal clavicle parallel to the edge of the acromion. I was able to fit two widths of the burr into the space between the distal clavicle and acromion, signifying that I had removed ~58mm of distal clavicle. This was confirmed by viewing anteriorly and introducing a probe with measuring marks from the lateral portal. Hemostasis was achieved with an Arthrocare wand. Fluid was evacuated from the shoulder.   A longitudinal incision from the anterolateral acromion ~6cm in length was made overlying the raphe between the anterior and middle heads of the deltoid. The raphe was identified and it was incised. The subacromial space was identified. Any remaining bursa was excised. The  rotator cuff tear was identified. It was a U-shaped tear.   We then turned our attention to the biceps tenodesis. The arm was externally rotated.  The bicipital groove was identified.  A 15 blade was used to make a cut overlying the biceps tendon, and the tendon was removed using a right angle clamp.  The base of the bicipital groove was identified and cleared of soft tissue.  A FiberTak anchor was placed low in the bicipital groove.  The biceps tendon was held at the appropriate amount of tension.  One set of sutures was passed through the biceps anchor with one limb passed in a simple fashion and the second limb passed in a simple plus locking stitch pattern.  This was repeated for the other set of sutures.  This construct allowed for shuttling the biceps tendon down to the bone.  The sutures were tied and cut.  The diseased portion of the proximal biceps was then excised.  At this point, the SutureTape from the subscapularis repair was found to have trapped deltoid muscle within it. Therefore, it was cut and the tape was removed, leaving the previous anchor in place. A FiberTape was passed through the subscapularis in a horizontal mattress fashion and this was inserted onto another 4.66mm SwiveLock. This was placed adjacent to the prior anchor. This again re-approximated the subscapularis down to its footprint on the lesser tuberosity.  The arm was then internally rotated.  The rotator cuff footprint was cleared of soft tissue and roughened with a rasp.  Two SPEED anchors were placed just lateral to the articular margin. Microfractuere of the greater tuberosity was performed with the SPEED anchor inserter.  All 8 strands of sutures were passed through the cuff in  a manner that would allow for appropriate reduction. One limb of each suture was loaded onto an Arthrex 4.26mm SwiveLock anchor. The other limb of each suture was loaded onto another Arthrex 4.34mm SwiveLock anchor. These were placed into the  lateral shoulder ~2cm inferior to the rotator cuff footprint. Sutures were tensioned appropriately and anchors were inserted for the lateral row.  This allowed for excellent reapproximation of the rotator cuff over its footprint.  The construct was stable with external and internal rotation. There was a large opening in the rotator interval. A 2-0 FiberWire suture was passed through the distal aspect of the rotator interval to close it slightly.   The wound was thoroughly irrigated.  The deltoid split was closed with 0 Vicryl.  The subdermal layer was closed with 2-0 Vicryl.  The skin was closed with 4-0 Monocryl and closed with Dermabond. The portals were closed with 3-0 Nylon. Xeroform was applied to the incisions. A sterile dressing was applied, followed by a Polar Care sleeve and a SlingShot shoulder immobilizer/sling. The patient awoke from anesthesia without difficulty and was transferred to the PACU in stable condition.   COMPLICATIONS: none  DISPOITION: plan for discharge home after recovery in PACU   POSTOPERATIVE PLAN: Remain in sling (except hygiene and elbow/wrist/hand RoM exercises as instructed by PT) x 6 weeks and NWB for this time. PT to begin 3-4 days after surgery. Rotator cuff repair (with subscapularis repair) and biceps tenodesis rehab protocol.

## 2018-01-05 NOTE — Anesthesia Post-op Follow-up Note (Signed)
Anesthesia QCDR form completed.        

## 2018-01-05 NOTE — H&P (Signed)
Paper H&P to be scanned into permanent record. H&P reviewed. No significant changes noted.  

## 2018-01-05 NOTE — Discharge Instructions (Signed)
Interscalene Nerve Block, Care After This sheet gives you information about how to care for yourself after your procedure. Your health care provider may also give you more specific instructions. If you have problems or questions, contact your health care provider. What can I expect after the procedure? After the procedure, it is common to have:  Soreness or tenderness in your neck.  Numbness in your shoulder, upper arm, and some fingers.  Weakness in your shoulder and arm muscles.  The feeling and strength in your shoulder, arm, and fingers should return to normal within hours after your procedure. Follow these instructions at home: For at least 24 hours after the procedure:  Do not: ? Participate in activities in which you could fall or become injured. ? Drive. ? Use heavy machinery. ? Drink alcohol. ? Take sleeping pills or medicines that cause drowsiness. ? Make important decisions or sign legal documents. ? Take care of children on your own.  Rest. Eating and drinking  If you vomit, drink water, juice, or soup when you can drink without vomiting.  Make sure you have little or no nausea before eating solid foods.  Follow the diet that is recommended by your health care provider. If you have a sling:  Wear it as told by your health care provider. Remove it only as told by your health care provider.  Loosen the sling if your fingers tingle, become numb, or turn cold and blue.  Make sure that your entire arm, including your wrist, is supported. Do not allow your wrist to dangle over the end of the sling.  Do not let your sling get wet if it is not waterproof.  Keep the sling clean. Bathing  Do not take baths, swim, or use a hot tub until your health care provider approves.  If you have a nerve block catheter in place, keep the incision site and tubing dry. Injection site care  Wash your hands with soap and water before you change your bandage (dressing). If soap and  water are not available, use hand sanitizer.  Change your dressing as told by your health care provider.  Keep your dressing dry.  Check your nerve block injection site every day for signs of infection. Check for: ? Redness, swelling, or pain. ? Fluid or blood. ? Warmth. Activity  Do not perform complex or risky activities while taking prescription pain medicine and until you have fully recovered.  Return to your normal activities as told by your health care provider and as you can tolerate them. Ask your health care provider what activities are safe for you.  Rest and take it easy. This will help you heal and recover more quickly and fully.  Be very cautious until you have regained strength and sensation. General instructions  Have a responsible adult stay with you until you are awake and alert.  Do not drive or use heavy machinery while taking prescription pain medicine and until you have fully recovered. Ask your health care provider when it is safe to drive.  Take over-the-counter and prescription medicines only as told by your health care provider.  If you smoke, do not smoke without supervision.  Do not expose your arm or shoulder to very cold or very hot temperatures until you have full feeling back.  If you have a nerve block catheter in place: ? Try to keep the catheter from getting kinked or pinched. ? Avoid pulling or tugging on the catheter.  Keep all follow-up visits as told  by your health care provider. This is important. Contact a health care provider if:  You have chills or fever.  You have redness, swelling, or pain around your injection site.  You have fluid or blood coming from the injection site.  The skin around the injection site is warm to the touch.  There is a bad smell coming from your dressing.  You have hoarseness or a drooping or dry eye that lasts more than a few days.  You have pain that is poorly controlled with the block or with pain  medicine.  You have numbness, tingling, or weakness in your shoulder or arm that lasts for more than one week. Get help right away if:  You have severe pain.  You lose or do not regain strength and sensation in your arm even after the nerve block medicine has stopped.  You have trouble breathing.  You have a nerve block catheter still in place and you begin to shiver.  You have a nerve block catheter still in place and you are getting more and more numb or weak. This information is not intended to replace advice given to you by your health care provider. Make sure you discuss any questions you have with your health care provider. Document Released: 05/22/2015 Document Revised: 01/29/2016 Document Reviewed: 01/29/2016 Elsevier Interactive Patient Education  2018 [employer] Post-Op Instructions - Rotator Cuff Repair  1. Bracing: You will wear a shoulder immobilizer or sling for 6 weeks.   2. Driving: No driving for 3 weeks post-op. When driving, do not wear the immobilizer. Ideally, we recommend no driving for 6 weeks while sling is in place as one arm will be immobilized.   3. Activity: No active lifting for 2 months. Wrist, hand, and elbow motion only. Avoid lifting the upper arm away from the body except for hygiene. You are permitted to bend and straighten the elbow passively only (no active elbow motion). You may use your hand and wrist for typing, writing, and managing utensils (cutting food). Do not lift more than a coffee cup for 8 weeks.  When sleeping or resting, inclined positions (recliner chair or wedge pillow) and a pillow under the forearm for support may provide better comfort for up to 4 weeks.  Avoid long distance travel for 4 weeks.  Return to normal activities after rotator cuff repair repair normally takes 6 months on average. If rehab goes very well, may be able to do most activities at 4 months, except overhead or contact sports.  4. Physical Therapy: Begins 3-4  days after surgery, and proceed 1 time per week for the first 6 weeks, then 1-2 times per week from weeks 6-20 post-op.  5. Medications:  - You will be provided a prescription for narcotic pain medicine. After surgery, take 1-2 narcotic tablets every 4 hours if needed for severe pain.  - A prescription for anti-nausea medication will be provided in case the narcotic medicine causes nausea - take 1 tablet every 6 hours only if nauseated.   - Take tylenol 1000 mg (2 Extra Strength tablets or 3 regular strength) every 8 hours for pain.  May decrease or stop tylenol 5 days after surgery if you are having minimal pain. - Take ASA 325mg /day x 2 weeks to help prevent DVTs/PEs (blood clots).  - DO NOT take ANY nonsteroidal anti-inflammatory pain medications (Advil, Motrin, Ibuprofen, Aleve, Naproxen, or Naprosyn). These medicines can inhibit healing of your shoulder repair.    If you are taking prescription  medication for anxiety, depression, insomnia, muscle spasm, chronic pain, or for attention deficit disorder, you are advised that you are at a higher risk of adverse effects with use of narcotics post-op, including narcotic addiction/dependence, depressed breathing, death. If you use non-prescribed substances: alcohol, marijuana, cocaine, heroin, methamphetamines, etc., you are at a higher risk of adverse effects with use of narcotics post-op, including narcotic addiction/dependence, depressed breathing, death. You are advised that taking > 50 morphine milligram equivalents (MME) of narcotic pain medication per day results in twice the risk of overdose or death. For your prescription provided: oxycodone 5 mg - taking more than 6 tablets per day would result in > 50 morphine milligram equivalents (MME) of narcotic pain medication. Be advised that we will prescribe narcotics short-term, for acute post-operative pain only - 3 weeks for major operations such as shoulder repair/reconstruction surgeries.      6. Post-Op Appointment:  Your first post-op appointment will be 10-14 days post-op.  7. Work or School: For most, but not all procedures, we advise staying out of work or school for at least 1 to 2 weeks in order to recover from the stress of surgery and to allow time for healing.   If you need a work or school note this can be provided.   8. Smoking: If you are a smoker, you need to refrain from smoking in the postoperative period. The nicotine in cigarettes will inhibit healing of your shoulder repair and decrease the chance of successful repair. Similarly, nicotine containing products (gum, patches) should be avoided.   Post-operative Brace: Apply and remove the brace you received as you were instructed to at the time of fitting and as described in detail as the braces instructions for use indicate.  Wear the brace for the period of time prescribed by your physician.  The brace can be cleaned with soap and water and allowed to air dry only.  Should the brace result in increased pain, decreased feeling (numbness/tingling), increased swelling or an overall worsening of your medical condition, please contact your doctor immediately.  If an emergency situation occurs as a result of wearing the brace after normal business hours, please dial 911 and seek immediate medical attention.  Let your doctor know if you have any further questions about the brace issued to you. Refer to the shoulder sling instructions for use if you have any questions regarding the correct fit of your shoulder sling.  [facility] Customer Care for Troubleshooting: [phone]  Video that illustrates how to properly use a shoulder sling: "Instructions for Proper Use of an Orthopaedic Sling" http://bass.com/https://www.youtube.com/watch?[email or web address]

## 2018-01-05 NOTE — Anesthesia Procedure Notes (Signed)
Procedure Name: Intubation Date/Time: 01/05/2018 12:43 PM Performed by: Manning CharityEvans, Geoffry Bannister B, CRNA Pre-anesthesia Checklist: Patient identified, Emergency Drugs available, Suction available, Patient being monitored and Timeout performed Patient Re-evaluated:Patient Re-evaluated prior to induction Oxygen Delivery Method: Circle system utilized Preoxygenation: Pre-oxygenation with 100% oxygen Induction Type: IV induction Ventilation: Mask ventilation without difficulty Grade View: Grade IV Tube type: Oral Tube size: 7.5 mm Number of attempts: 2 Placement Confirmation: ETT inserted through vocal cords under direct vision,  positive ETCO2 and breath sounds checked- equal and bilateral Secured at: 24 cm Tube secured with: Tape Dental Injury: Teeth and Oropharynx as per pre-operative assessment

## 2018-01-05 NOTE — Anesthesia Procedure Notes (Signed)
Anesthesia Regional Block: Interscalene brachial plexus block   Pre-Anesthetic Checklist: ,, timeout performed, Correct Patient, Correct Site, Correct Laterality, Correct Procedure, Correct Position, site marked, Risks and benefits discussed,  Surgical consent,  Pre-op evaluation,  At surgeon's request and post-op pain management  Laterality: Right  Prep: chloraprep       Needles:  Injection technique: Single-shot  Needle Type: Stimiplex     Needle Length: 10cm  Needle Gauge: 21     Additional Needles:   Procedures:,,,, ultrasound used (permanent image in chart),,,,  Narrative:  Start time: 01/05/2018 11:19 AM End time: 01/05/2018 11:25 AM Injection made incrementally with aspirations every 5 mL.  Performed by: Personally  Anesthesiologist: Alver FisherPenwarden, Season Astacio, MD  Additional Notes: Functioning IV was confirmed and monitors were applied.  A Stimuplex needle was used. Sterile prep and drape,hand hygiene and sterile gloves were used.  Negative aspiration and negative test dose prior to incremental administration of local anesthetic. The patient tolerated the procedure well.

## 2018-01-05 NOTE — Anesthesia Preprocedure Evaluation (Signed)
Anesthesia Evaluation  Patient identified by MRN, date of birth, ID band Patient awake    Reviewed: Allergy & Precautions, NPO status , Patient's Chart, lab work & pertinent test results  History of Anesthesia Complications Negative for: history of anesthetic complications  Airway Mallampati: III  TM Distance: >3 FB Neck ROM: Full    Dental no notable dental hx.    Pulmonary asthma , sleep apnea and Continuous Positive Airway Pressure Ventilation , former smoker,    breath sounds clear to auscultation- rhonchi (-) wheezing      Cardiovascular hypertension, Pt. on medications (-) CAD, (-) Past MI, (-) Cardiac Stents and (-) CABG  Rhythm:Regular Rate:Normal - Systolic murmurs and - Diastolic murmurs    Neuro/Psych Anxiety negative neurological ROS     GI/Hepatic negative GI ROS, Neg liver ROS,   Endo/Other  diabetes, Oral Hypoglycemic Agents  Renal/GU negative Renal ROS     Musculoskeletal negative musculoskeletal ROS (+)   Abdominal (+) + obese,   Peds  Hematology negative hematology ROS (+)   Anesthesia Other Findings Past Medical History: No date: Anxiety No date: Asthma No date: Diabetes mellitus without complication (HCC) No date: High cholesterol No date: Hypertension No date: Psoriasis 1970: Skull fracture (HCC) No date: Sleep apnea   Reproductive/Obstetrics                             Anesthesia Physical Anesthesia Plan  ASA: II  Anesthesia Plan: General   Post-op Pain Management:  Regional for Post-op pain   Induction: Intravenous  PONV Risk Score and Plan: 1 and Ondansetron and Midazolam  Airway Management Planned: Oral ETT  Additional Equipment:   Intra-op Plan:   Post-operative Plan: Extubation in OR  Informed Consent: I have reviewed the patients History and Physical, chart, labs and discussed the procedure including the risks, benefits and alternatives for  the proposed anesthesia with the patient or authorized representative who has indicated his/her understanding and acceptance.   Dental advisory given  Plan Discussed with: CRNA and Anesthesiologist  Anesthesia Plan Comments:         Anesthesia Quick Evaluation

## 2018-01-05 NOTE — Transfer of Care (Signed)
Immediate Anesthesia Transfer of Care Note  Patient: Roberto SarkDavid Carbine  Procedure(s) Performed: SHOULDER ARTHROSCOPY WITH OPEN ROTATOR CUFF REPAIR DISTAL CLAVICLE EXCISION (Right ) BICEPS TENODESIS (Right )  Patient Location: PACU  Anesthesia Type:General  Level of Consciousness: sedated  Airway & Oxygen Therapy: Patient Spontanous Breathing and Patient connected to face mask oxygen  Post-op Assessment: Report given to RN and Post -op Vital signs reviewed and stable  Post vital signs: Reviewed and stable  Last Vitals:  Vitals Value Taken Time  BP 117/66 01/05/2018  5:31 PM  Temp    Pulse 103 01/05/2018  5:32 PM  Resp 18 01/05/2018  5:32 PM  SpO2 94 % 01/05/2018  5:32 PM  Vitals shown include unvalidated device data.  Last Pain:  Vitals:   01/05/18 0947  TempSrc: Oral  PainSc:          Complications: No apparent anesthesia complications

## 2018-01-07 ENCOUNTER — Other Ambulatory Visit: Payer: Self-pay

## 2018-01-07 ENCOUNTER — Emergency Department: Payer: Medicare Other

## 2018-01-07 ENCOUNTER — Emergency Department
Admission: EM | Admit: 2018-01-07 | Discharge: 2018-01-08 | Disposition: A | Discharge status: Home / Self Care | Payer: Medicare Other | Attending: Emergency Medicine | Admitting: Emergency Medicine

## 2018-01-07 DIAGNOSIS — Z87891 Personal history of nicotine dependence: Secondary | ICD-10-CM | POA: Diagnosis not present

## 2018-01-07 DIAGNOSIS — L03113 Cellulitis of right upper limb: Secondary | ICD-10-CM | POA: Insufficient documentation

## 2018-01-07 DIAGNOSIS — E119 Type 2 diabetes mellitus without complications: Secondary | ICD-10-CM | POA: Diagnosis not present

## 2018-01-07 DIAGNOSIS — I10 Essential (primary) hypertension: Secondary | ICD-10-CM | POA: Insufficient documentation

## 2018-01-07 DIAGNOSIS — G8918 Other acute postprocedural pain: Secondary | ICD-10-CM | POA: Insufficient documentation

## 2018-01-07 DIAGNOSIS — J45909 Unspecified asthma, uncomplicated: Secondary | ICD-10-CM | POA: Insufficient documentation

## 2018-01-07 DIAGNOSIS — Z7984 Long term (current) use of oral hypoglycemic drugs: Secondary | ICD-10-CM | POA: Insufficient documentation

## 2018-01-07 DIAGNOSIS — Z79899 Other long term (current) drug therapy: Secondary | ICD-10-CM | POA: Diagnosis not present

## 2018-01-07 DIAGNOSIS — R0602 Shortness of breath: Secondary | ICD-10-CM | POA: Insufficient documentation

## 2018-01-07 LAB — BASIC METABOLIC PANEL
Anion gap: 11 (ref 5–15)
BUN: 17 mg/dL (ref 8–23)
CO2: 25 mmol/L (ref 22–32)
Calcium: 8.6 mg/dL — ABNORMAL LOW (ref 8.9–10.3)
Chloride: 99 mmol/L (ref 98–111)
Creatinine, Ser: 0.8 mg/dL (ref 0.61–1.24)
GFR calc Af Amer: >60 mL/min (ref >60)
GFR calc non Af Amer: >60 mL/min (ref >60)
Glucose, Bld: 168 mg/dL — ABNORMAL HIGH (ref 70–99)
Potassium: 3.7 mmol/L (ref 3.5–5.1)
SODIUM: 135 mmol/L (ref 135–145)

## 2018-01-07 LAB — PROTIME-INR
INR: 1.17
Prothrombin Time: 14.8 seconds (ref 11.4–15.2)

## 2018-01-07 LAB — CBC WITH DIFFERENTIAL/PLATELET
Basophils Absolute: 0.2 10*3/uL — ABNORMAL HIGH (ref 0–0.1)
Basophils Relative: 1 %
Eosinophils Absolute: 0.1 10*3/uL (ref 0–0.7)
Eosinophils Relative: 1 %
HCT: 52.5 % — ABNORMAL HIGH (ref 40.0–52.0)
HEMOGLOBIN: 18.2 g/dL — AB (ref 13.0–18.0)
Lymphocytes Relative: 16 %
Lymphs Abs: 2.4 10*3/uL (ref 1.0–3.6)
MCH: 32.8 pg (ref 26.0–34.0)
MCHC: 34.7 g/dL (ref 32.0–36.0)
MCV: 94.5 fL (ref 80.0–100.0)
Monocytes Absolute: 1.5 10*3/uL — ABNORMAL HIGH (ref 0.2–1.0)
Monocytes Relative: 10 %
NEUTROS PCT: 72 %
Neutro Abs: 10.4 10*3/uL — ABNORMAL HIGH (ref 1.4–6.5)
Platelets: 217 10*3/uL (ref 150–440)
RBC: 5.55 MIL/uL (ref 4.40–5.90)
RDW: 13 % (ref 11.5–14.5)
WBC: 14.5 10*3/uL — AB (ref 3.8–10.6)

## 2018-01-07 MED ORDER — SULFAMETHOXAZOLE-TRIMETHOPRIM 800-160 MG PO TABS: 1.0000 | ORAL_TABLET | Freq: Two times a day (BID) | ORAL | 0 refills | Status: DC

## 2018-01-07 MED ORDER — SULFAMETHOXAZOLE-TRIMETHOPRIM 800-160 MG PO TABS
1.0000 | ORAL_TABLET | Freq: Once | ORAL | Status: AC
  Administered 2018-01-08: 1 via ORAL
  Filled 2018-01-07: qty 1

## 2018-01-07 MED ORDER — SODIUM CHLORIDE 0.9 % IV BOLUS
1000.0000 mL | Freq: Once | INTRAVENOUS | Status: AC
  Administered 2018-01-07: 1000 mL via INTRAVENOUS

## 2018-01-07 MED ORDER — CEPHALEXIN 500 MG PO CAPS
500.0000 mg | ORAL_CAPSULE | Freq: Once | ORAL | Status: AC
  Administered 2018-01-08: 500 mg via ORAL
  Filled 2018-01-07: qty 1

## 2018-01-07 MED ORDER — CEPHALEXIN 500 MG PO CAPS: 500.0000 mg | ORAL_CAPSULE | Freq: Three times a day (TID) | ORAL | 0 refills | Status: DC

## 2018-01-07 MED ORDER — IOPAMIDOL (ISOVUE-370) INJECTION 76%
75.0000 mL | Freq: Once | INTRAVENOUS | Status: AC | PRN
  Administered 2018-01-07: 75 mL via INTRAVENOUS

## 2018-01-07 NOTE — ED Notes (Signed)
Patient's wife states she gave the patient his home pain meds.

## 2018-01-07 NOTE — ED Triage Notes (Signed)
Patient presents with shortness of breath and wheezing after having surgery on the right shoulder on Friday. Wife is a Engineer, civil (consulting)nurse and has been checking his oxygen saturation and at one point she could only get 88%. Patient was reported to be short of breath while at rest. Patient is currently in pain with his shoulder but no chest pain.

## 2018-01-07 NOTE — ED Provider Notes (Signed)
[facility] Emergency Department Provider Note  ____________________________________________  Time seen: Approximately 11:55 PM  I have reviewed the triage vital signs and the nursing notes.   HISTORY  Chief Complaint Shortness of Breath    HPI Roberto Christian is a 67 y.o. male with a history of hypertension diabetes and 2 days status post right rotator cuff surgery who complains of shortness of breath and wheezing that started today.  Oxygen saturation at home was 88% when checked by his wife who is a Engineer, civil (consulting).  Also complains of pain in the right shoulder but that is not worsening.  Denies chest pain.  Symptoms are constant, no aggravating or alleviating factors, gradual onset, mild to moderate severity.      Past Medical History:  Diagnosis Date  . Anxiety   . Asthma   . Diabetes mellitus without complication (HCC)   . High cholesterol   . Hypertension   . Psoriasis   . Skull fracture (HCC) 1970  . Sleep apnea      There are no active problems to display for this patient.    Past Surgical History:  Procedure Laterality Date  . ADENOIDECTOMY    . NOSE SURGERY       Prior to Admission medications   Medication Sig Start Date End Date Taking? Authorizing Provider  acetaminophen (TYLENOL) 500 MG tablet Take 2 tablets (1,000 mg total) by mouth every 8 (eight) hours. 01/05/18 01/05/19  Signa Kell, MD  aspirin EC 325 MG tablet Take 1 tablet (325 mg total) by mouth daily for 14 days. 01/05/18 01/19/18  Signa Kell, MD  cephALEXin (KEFLEX) 500 MG capsule Take 1 capsule (500 mg total) by mouth 3 (three) times daily. 01/07/18   Sharman Cheek, MD  fluticasone [facility]) 50 MCG/ACT nasal spray Place 1 spray into the nose daily as needed for allergies.     [provider]  Lancets MISC Use 1 each once daily Use as instructed. 11/24/17 11/24/18  [provider]  Melatonin 5 MG TABS Take 5 mg by mouth at bedtime as needed (sleep).    [provider]  metFORMIN (GLUCOPHAGE-XR) 500 MG 24 hr tablet Take 500 mg by mouth 2 (two) times daily.  11/10/17 11/10/18  [provider]  ondansetron (ZOFRAN ODT) 4 MG disintegrating tablet Take 1 tablet (4 mg total) by mouth every 8 (eight) hours as needed for nausea or vomiting. 01/05/18   Signa Kell, MD  oxyCODONE (ROXICODONE) 5 MG immediate release tablet Take 1-2 tablets (5-10 mg total) by mouth every 4 (four) hours as needed (pain). 01/05/18 01/05/19  Signa Kell, MD  sildenafil (REVATIO) 20 MG tablet 1-5 tabs 1 hour prior to intercourse 09/27/17   Stoioff, Verna Czech, MD  sulfamethoxazole-trimethoprim (BACTRIM DS) 800-160 MG tablet Take 1 tablet by mouth 2 (two) times daily. 01/07/18   Sharman Cheek, MD  testosterone cypionate (DEPOTESTOSTERONE CYPIONATE) 200 MG/ML injection Inject 1 mL (200 mg total) into the muscle every 14 (fourteen) days. 09/28/17   Stoioff, Verna Czech, MD  triamcinolone cream (KENALOG) 0.1 % Apply 1 application topically daily as needed (psoriasis).     [provider]     Allergies Patient has no known allergies.   Family History  Problem Relation Age of Onset  . Cancer Mother 66  . Heart attack Father 19  . Diabetes Maternal Grandmother   . Bladder Cancer Neg Hx   . Kidney cancer Neg Hx     Social History Social History  Tobacco Use  . Smoking status: Former Smoker    Packs/day: 1.00    Years: 20.00    Pack years: 20.00    Types: Cigarettes    Last attempt to quit: 06/13/1988    Years since quitting: 29.5  . Smokeless tobacco: Never Used  Substance Use Topics  . Alcohol use: Yes    Alcohol/week: 1.2 oz    Types: 2 Shots of liquor per week  . Drug use: Never    Review of Systems  Constitutional:   Positive fever.  ENT:   No sore throat. No rhinorrhea. Cardiovascular:   No chest pain or syncope. Respiratory:   Positive shortness of breath without cough. Gastrointestinal:   Negative for abdominal pain, vomiting and diarrhea.   Musculoskeletal: Positive right shoulder pain All other systems reviewed and are negative except as documented above in ROS and HPI.  ____________________________________________   PHYSICAL EXAM:  VITAL SIGNS: ED Triage Vitals  Enc Vitals Group     BP 01/07/18 1952 (!) 162/100     Pulse Rate 01/07/18 1952 (!) 115     Resp 01/07/18 1952 16     Temp 01/07/18 1952 100.2 F (37.9 C)     Temp Source 01/07/18 1952 Oral     SpO2 01/07/18 1952 92 %     Weight 01/07/18 1953 203 lb (92.1 kg)     Height 01/07/18 1953 5\' 7"  (1.702 m)     Head Circumference --      Peak Flow --      Pain Score 01/07/18 1952 4     Pain Loc --      Pain Edu? --      Excl. in GC? --     Vital signs reviewed, nursing assessments reviewed.   Constitutional:   Alert and oriented. Non-toxic appearance. Eyes:   Conjunctivae are normal. EOMI. PERRL. ENT      Head:   Normocephalic and atraumatic.      Nose:   No congestion/rhinnorhea.       Mouth/Throat:   MMM, no pharyngeal erythema. No peritonsillar mass.       Neck:   No meningismus. Full ROM. Hematological/Lymphatic/Immunilogical:   No cervical lymphadenopathy. Cardiovascular:   Tachycardia heart rate 105. Symmetric bilateral radial and DP pulses.  No murmurs. Cap refill less than 2 seconds. Respiratory:   Normal respiratory effort without tachypnea/retractions. Breath sounds are clear and equal bilaterally. No wheezes/rales/rhonchi. Gastrointestinal:   Soft and nontender. Non distended. There is no CVA tenderness.  No rebound, rigidity, or guarding.  Musculoskeletal:   Tenderness at the right shoulder, diffuse inflammatory changes over the proximal humerus extending over the superior aspect of the shoulder AC joint and clavicular area.  No crepitus.  3 laparoscopic port sites and one open incision are all closed and healing well without drainage or focal inflammatory changes.  There is diffuse well demarcated erythema around the area.  No fluctuance or  skin induration.  It is warm to touch and very sensitive to touch.   .  No lower extremity tenderness.  No edema. Neurologic:   Normal speech and language.  Motor grossly intact. No acute focal neurologic deficits are appreciated.  Skin:    Skin is warm, dry and intact.  Inflammatory changes over the right shoulder as above ____________________________________________    LABS (pertinent positives/negatives) (all labs ordered are listed, but only abnormal results are displayed) Labs Reviewed  BASIC METABOLIC PANEL - Abnormal; Notable for the following components:  Result Value   Glucose, Bld 168 (*)    Calcium 8.6 (*)    All other components within normal limits  CBC WITH DIFFERENTIAL/PLATELET - Abnormal; Notable for the following components:   WBC 14.5 (*)    Hemoglobin 18.2 (*)    HCT 52.5 (*)    Neutro Abs 10.4 (*)    Monocytes Absolute 1.5 (*)    Basophils Absolute 0.2 (*)    All other components within normal limits  PROTIME-INR   ____________________________________________   EKG  Interpreted by me Sinus tachycardia rate 100, left axis, normal intervals.  Poor R wave progression.  Normal ST segments and T waves.  ____________________________________________    RADIOLOGY  Ct Angio Chest Pe W And/or Wo Contrast  Result Date: 01/07/2018 CLINICAL DATA:  Shortness of breath, wheezing EXAM: CT ANGIOGRAPHY CHEST WITH CONTRAST TECHNIQUE: Multidetector CT imaging of the chest was performed using the standard protocol during bolus administration of intravenous contrast. Multiplanar CT image reconstructions and MIPs were obtained to evaluate the vascular anatomy. CONTRAST:  75mL ISOVUE-370 IOPAMIDOL (ISOVUE-370) INJECTION 76% COMPARISON:  Chest x-ray earlier today. FINDINGS: Cardiovascular: No filling defects in the pulmonary arteries to suggest pulmonary emboli. Mild cardiomegaly. Aorta is normal caliber. Mediastinum/Nodes: No mediastinal, hilar, or axillary adenopathy.  Lungs/Pleura: Elevation of the right hemidiaphragm with right base atelectasis. Minimal left base atelectasis. No effusions. Upper Abdomen: Imaging into the upper abdomen shows no acute findings. Musculoskeletal: Chest wall soft tissues are unremarkable. Gas noted within the soft tissues in the right upper extremity, presumably related to recent shoulder surgery. No acute bony abnormality. Review of the MIP images confirms the above findings. IMPRESSION: No evidence of pulmonary embolus. Elevation of the right hemidiaphragm with right base atelectasis. Minimal left base atelectasis. Mild cardiomegaly. Electronically Signed   By: Charlett NoseKevin  Dover M.D.   On: 01/07/2018 21:18   Dg Chest Portable 1 View  Result Date: 01/07/2018 CLINICAL DATA:  Shortness of breath and wheezing. EXAM: PORTABLE CHEST 1 VIEW COMPARISON:  October 05, 2008 FINDINGS: There is elevation the right hemidiaphragm which is significantly increased since 2010. Adjacent atelectasis noted. The left lung base is not completely evaluated on today's study. Probable cardiomegaly, incompletely evaluated. The hila and mediastinum are normal. No other acute abnormalities. No pneumothorax. IMPRESSION: 1. Marked elevation of the right hemidiaphragm significantly increased since 2010 with adjacent atelectasis. 2. Left lung base is not completely evaluated/included on this study. Visualized portions are normal. 3. Possible cardiomegaly. Electronically Signed   By: Gerome Samavid  Williams III M.D   On: 01/07/2018 20:58    ____________________________________________   PROCEDURES Procedures  ____________________________________________  DIFFERENTIAL DIAGNOSIS   Pneumonia, pulmonary embolism, pneumothorax, atelectasis, postop cellulitis  CLINICAL IMPRESSION / ASSESSMENT AND PLAN / ED COURSE  Pertinent labs & imaging results that were available during my care of the patient were reviewed by me and considered in my medical decision making (see chart for  details).      Clinical Course as of Jan 07 2354  Wynelle LinkSun Jan 07, 2018  2046 Dyspnea, tachycardia, recent surgery. Cxr unremarkable. Will get CTA chest eval PE. Pt took his own 10mg  oxycodone PO in room.    [PS]  2124 CT negative.  There is some evidence of right base atelectasis, somewhat expected postop.  No evidence of pulmonary embolism pneumonia or pneumothorax.  I will treat with Bactrim and Keflex for his apparent cellulitis around the surgical site, follow-up with his surgeon this week.   [PS]    Clinical Course User  Index [PS] Sharman Cheek, MD     ----------------------------------------- 11:56 PM on 01/07/2018 -----------------------------------------  Vitals remained stable, not hypoxic in the treatment room.  Fever under control.  CT angiogram negative for PE.  Exam is not clearly cellulitis, and there is no drainage from the incisions or focal inflammation at the incisions, with his low-grade fever and inflammatory changes in that area I will treat with antibiotics although this all could be uncomplicated postop healing.  Patient informed of all results and agreeable with plan.  ____________________________________________   FINAL CLINICAL IMPRESSION(S) / ED DIAGNOSES    Final diagnoses:  Cellulitis of right upper extremity  Post-op pain  Shortness of breath     ED Discharge Orders        Ordered    cephALEXin (KEFLEX) 500 MG capsule  3 times daily     01/07/18 2355    sulfamethoxazole-trimethoprim (BACTRIM DS) 800-160 MG tablet  2 times daily     01/07/18 2355      Portions of this note were generated with dragon dictation software. Dictation errors may occur despite best attempts at proofreading.    Sharman Cheek, MD 01/08/18 0000

## 2018-01-08 ENCOUNTER — Encounter: Payer: Self-pay | Admitting: Orthopedic Surgery

## 2018-01-09 NOTE — Anesthesia Postprocedure Evaluation (Signed)
Anesthesia Post Note  Patient: Roberto Christian  Procedure(s) Performed: SHOULDER ARTHROSCOPY WITH OPEN ROTATOR CUFF REPAIR DISTAL CLAVICLE EXCISION (Right ) BICEPS TENODESIS (Right )  Patient location during evaluation: PACU Anesthesia Type: General Level of consciousness: awake and alert and oriented Pain management: pain level controlled Vital Signs Assessment: post-procedure vital signs reviewed and stable Respiratory status: spontaneous breathing Cardiovascular status: blood pressure returned to baseline Anesthetic complications: no     Last Vitals:  Vitals:   01/05/18 1901 01/05/18 1915  BP: (!) 139/92 (!) 151/87  Pulse: (!) 104 (!) 104  Resp: (!) 23 15  Temp:  36.4 C  SpO2: 90% 90%    Last Pain:  Vitals:   01/08/18 0807  TempSrc:   PainSc: 4                  Malone Vanblarcom

## 2018-01-25 ENCOUNTER — Encounter: Payer: Medicare Other | Attending: Family Medicine | Admitting: *Deleted

## 2018-01-25 VITALS — Wt 194.6 lb

## 2018-01-25 DIAGNOSIS — Z713 Dietary counseling and surveillance: Secondary | ICD-10-CM | POA: Diagnosis not present

## 2018-01-25 DIAGNOSIS — Z6832 Body mass index (BMI) 32.0-32.9, adult: Secondary | ICD-10-CM | POA: Insufficient documentation

## 2018-01-25 DIAGNOSIS — E119 Type 2 diabetes mellitus without complications: Secondary | ICD-10-CM | POA: Insufficient documentation

## 2018-01-25 NOTE — Progress Notes (Signed)

## 2018-01-29 ENCOUNTER — Encounter: Payer: Self-pay | Admitting: *Deleted

## 2018-02-16 DIAGNOSIS — E119 Type 2 diabetes mellitus without complications: Secondary | ICD-10-CM | POA: Insufficient documentation

## 2018-02-16 DIAGNOSIS — I10 Essential (primary) hypertension: Secondary | ICD-10-CM | POA: Insufficient documentation

## 2018-03-28 ENCOUNTER — Encounter: Payer: Self-pay | Admitting: Urology

## 2018-03-28 ENCOUNTER — Ambulatory Visit (INDEPENDENT_AMBULATORY_CARE_PROVIDER_SITE_OTHER): Payer: Medicare Other | Admitting: Urology

## 2018-03-28 VITALS — BP 144/76 | HR 79 | Ht 66.0 in | Wt 187.9 lb

## 2018-03-28 DIAGNOSIS — E291 Testicular hypofunction: Secondary | ICD-10-CM | POA: Diagnosis not present

## 2018-03-28 NOTE — Progress Notes (Signed)
03/28/2018 9:48 AM   Roberto Christian August 10, 1950 [id number]  Referring provider: Dorothey Baseman, MD [phone] [name] [place], [place] [address]  Chief Complaint  Patient presents with  . Hypogonadism    HPI: 67 year old male previously followed for hypogonadism, erectile dysfunction and lower urinary tract symptoms.  He was previously followed by Dr. Evelene Croon.  Prior TUMT without improvement in his voiding symptoms.  I saw him at [facility] and video urodynamic study showed no evidence of outlet obstruction and decreased detrusor tone.  He presently has stable lower urinary tract symptoms.  He is on no medications.  He has not had an elevated PVR.  He uses sildenafil for erectile dysfunction.  He had been on TRT at 200 mg every 2 weeks however states 6 weeks ago he decided to discontinue therapy due to some ongoing health issues and his concern of the potential of increased CVA risk on testosterone replacement.   PMH: Past Medical History:  Diagnosis Date  . Anxiety   . Asthma   . Diabetes mellitus without complication (HCC)   . High cholesterol   . Hypertension   . Psoriasis   . Skull fracture (HCC) 1970  . Sleep apnea     Surgical History: Past Surgical History:  Procedure Laterality Date  . ADENOIDECTOMY    . BICEPT TENODESIS Right 01/05/2018   Procedure: BICEPS TENODESIS;  Surgeon: Signa Kell, MD;  Location: [facility] ORS;  Service: Orthopedics;  Laterality: Right;  . NOSE SURGERY    . SHOULDER ARTHROSCOPY WITH OPEN ROTATOR CUFF REPAIR Right 01/05/2018   Procedure: SHOULDER ARTHROSCOPY WITH OPEN ROTATOR CUFF REPAIR DISTAL CLAVICLE EXCISION;  Surgeon: Signa Kell, MD;  Location: [facility] ORS;  Service: Orthopedics;  Laterality: Right;    Home Medications:  Allergies as of 03/28/2018   No Known Allergies     Medication List        Accurate as of 03/28/18  9:48 AM. Always use your most recent med list.          acetaminophen 500 MG tablet Commonly known as:  TYLENOL Take 2 tablets  (1,000 mg total) by mouth every 8 (eight) hours.   Lancets Misc Use 1 each once daily Use as instructed.   lisinopril 20 MG tablet Commonly known as:  PRINIVIL,ZESTRIL Take by mouth.   Melatonin 5 MG Tabs Take 5 mg by mouth at bedtime as needed (sleep).   metFORMIN 500 MG 24 hr tablet Commonly known as:  GLUCOPHAGE-XR Take 500 mg by mouth 2 (two) times daily.   ondansetron 4 MG disintegrating tablet Commonly known as:  ZOFRAN-ODT Take 1 tablet (4 mg total) by mouth every 8 (eight) hours as needed for nausea or vomiting.   ONETOUCH VERIO test strip Generic drug:  glucose blood USE 3 (THREE) TIMES DAILY USE AS INSTRUCTED E11.9   oxyCODONE 5 MG immediate release tablet Commonly known as:  Oxy IR/ROXICODONE Take 1-2 tablets (5-10 mg total) by mouth every 4 (four) hours as needed (pain).   sertraline 50 MG tablet Commonly known as:  ZOLOFT Take by mouth.   sildenafil 20 MG tablet Commonly known as:  REVATIO 1-5 tabs 1 hour prior to intercourse   testosterone cypionate 200 MG/ML injection Commonly known as:  DEPOTESTOSTERONE CYPIONATE Inject 1 mL (200 mg total) into the muscle every 14 (fourteen) days.   traMADol 50 MG tablet Commonly known as:  ULTRAM TAKE 1 TABLET BY MOUTH EVERY 8 HOURS AS NEEDED FOR PAIN   triamcinolone cream 0.1 % Commonly known  as:  KENALOG Apply 1 application topically daily as needed (psoriasis).       Allergies: No Known Allergies  Family History: Family History  Problem Relation Age of Onset  . Cancer Mother 68  . Heart attack Father 27  . Diabetes Maternal Grandmother   . Bladder Cancer Neg Hx   . Kidney cancer Neg Hx     Social History:  reports that he quit smoking about 29 years ago. His smoking use included cigarettes. He has a 20.00 pack-year smoking history. He has never used smokeless tobacco. He reports that he drinks about 2.0 standard drinks of alcohol per week. He reports that he does not use  drugs.  ROS: UROLOGY Frequent Urination?: No Hard to postpone urination?: No Burning/pain with urination?: No Get up at night to urinate?: No Leakage of urine?: No Urine stream starts and stops?: No Trouble starting stream?: No Do you have to strain to urinate?: No Blood in urine?: No Urinary tract infection?: No Sexually transmitted disease?: No Injury to kidneys or bladder?: No Painful intercourse?: No Weak stream?: No Erection problems?: No Penile pain?: No  Gastrointestinal Nausea?: No Vomiting?: No Indigestion/heartburn?: No Diarrhea?: No Constipation?: No  Constitutional Fever: No Night sweats?: No Weight loss?: No Fatigue?: No  Skin Skin rash/lesions?: No Itching?: No  Eyes Blurred vision?: No Double vision?: No  Ears/Nose/Throat Sore throat?: No Sinus problems?: No  Hematologic/Lymphatic Swollen glands?: No Easy bruising?: No  Cardiovascular Leg swelling?: No Chest pain?: No  Respiratory Cough?: No Shortness of breath?: No  Endocrine Excessive thirst?: No  Musculoskeletal Back pain?: No Joint pain?: No  Neurological Headaches?: No Dizziness?: No  Psychologic Depression?: No Anxiety?: No  Physical Exam: BP (!) 144/76 (BP Location: Left Arm, Patient Position: Sitting, Cuff Size: Normal)   Pulse 79   Ht 5\' 6"  (1.676 m)   Wt 187 lb 14.4 oz (85.2 kg)   BMI 30.33 kg/m   Constitutional:  Alert and oriented, No acute distress.   Assessment & Plan:   67 year old male who desires to discontinue TRT.  His voiding pattern is stable.  If Dr. Terance Hart will refill his sildenafil as needed will see him back prn.    [name], MD  [facility] [address] [place], [place] [address] [phone]

## 2019-06-27 IMAGING — CT CT ANGIO CHEST
2 of 6 series · 18 of 46 positions shown · IV contrast (APPLIED)
Comparison: Chest x-ray earlier today.

CLINICAL DATA: Shortness of breath, wheezing

EXAM:
CT ANGIOGRAPHY CHEST WITH CONTRAST
TECHNIQUE: Multidetector CT imaging of the chest was performed using the
standard protocol during bolus administration of intravenous
contrast. Multiplanar CT image reconstructions and MIPs were
obtained to evaluate the vascular anatomy.
CONTRAST:  75mL 01FEO6-YHG IOPAMIDOL (01FEO6-YHG) INJECTION 76%

[Series 6: thins · axial · 0.70mm/px · z∈[-266,-16]mm · 15 of 274 slices shown]
[im 12/274  lung]
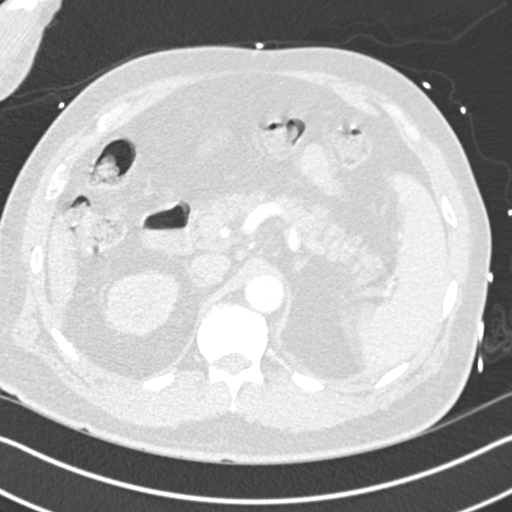
[im 36/274  soft-tissue]
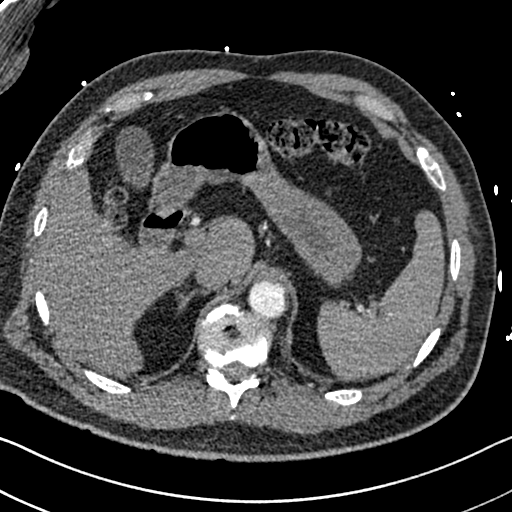
[im 48/274  lung]
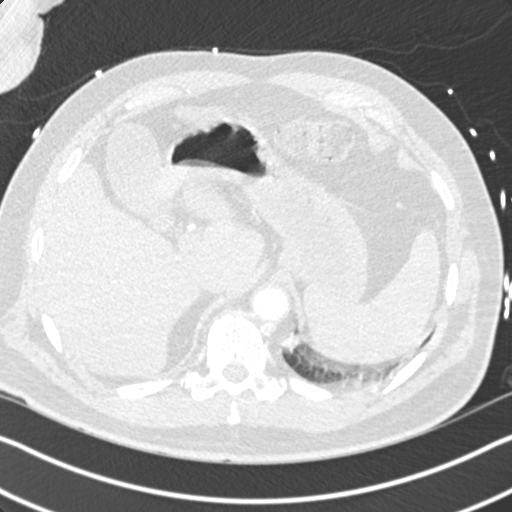
[im 72/274  soft-tissue]
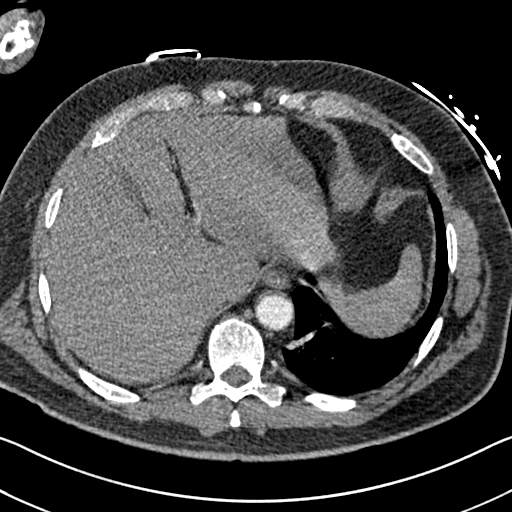
[im 84/274  lung]
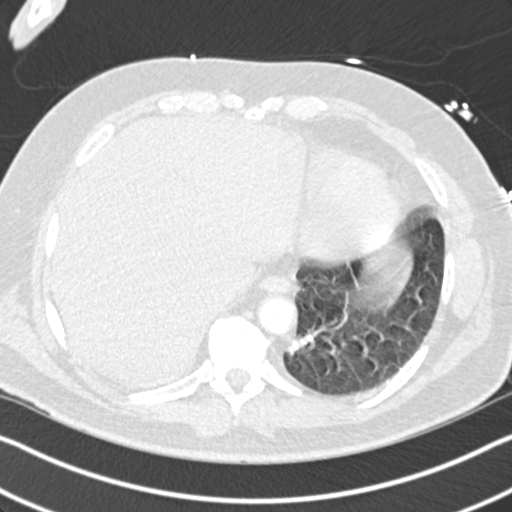
[im 107/274  soft-tissue]
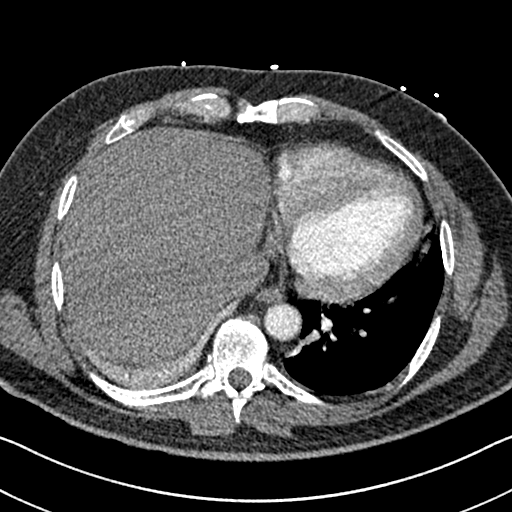
[im 119/274  lung]
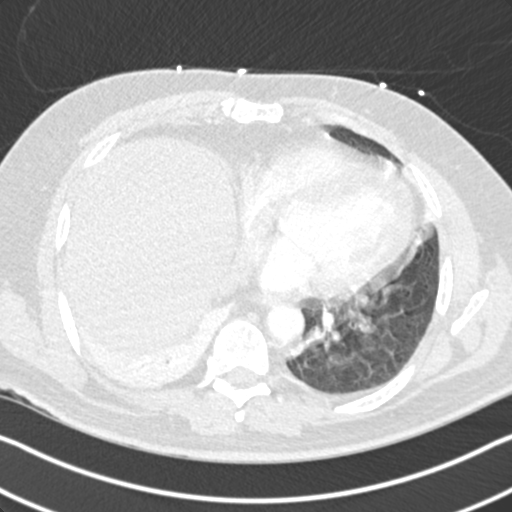
[im 143/274  soft-tissue]
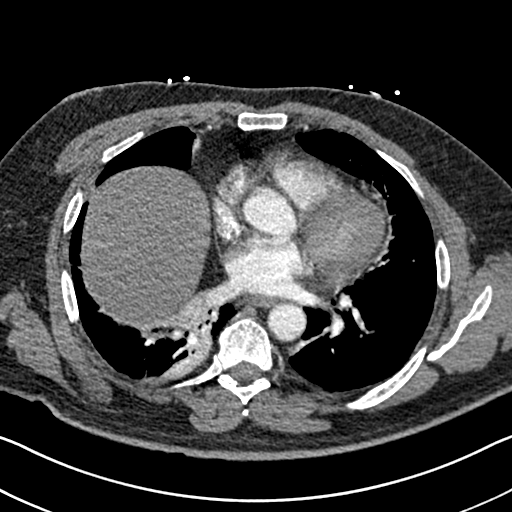
[im 155/274  lung]
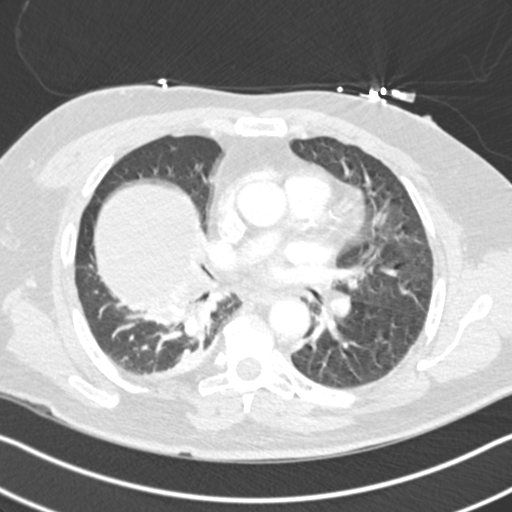
[im 167/274  soft-tissue]
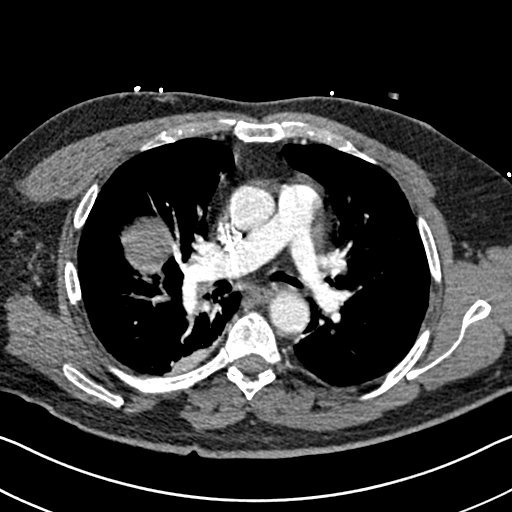
[im 190/274  lung]
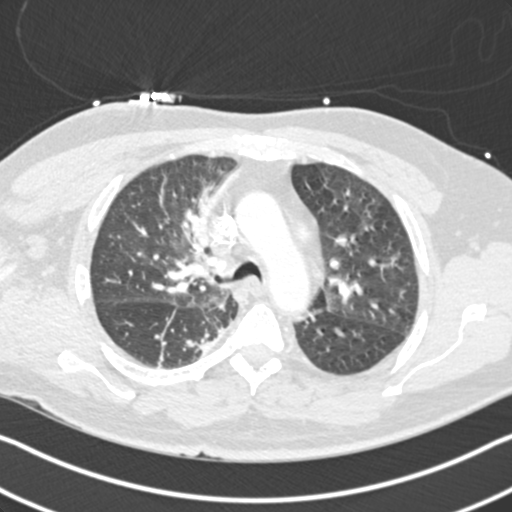
[im 202/274  soft-tissue]
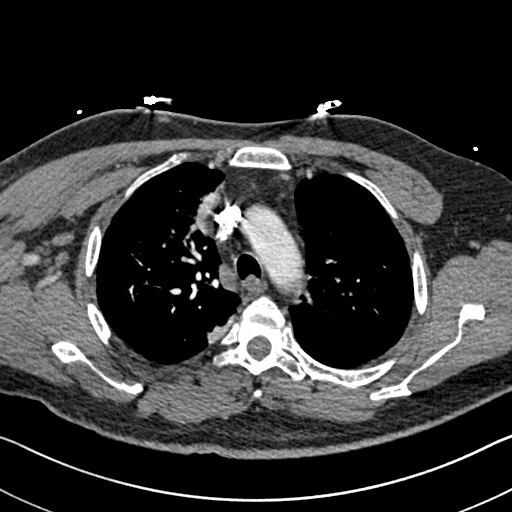
[im 226/274  lung]
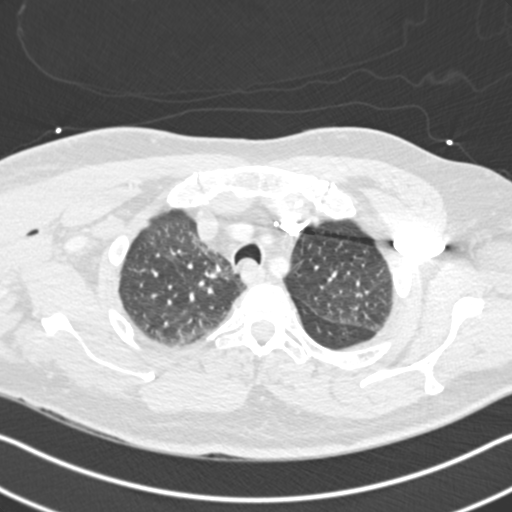
[im 238/274  soft-tissue]
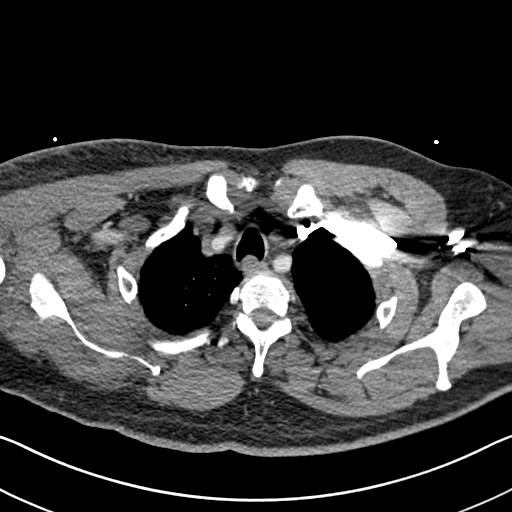
[im 262/274  lung]
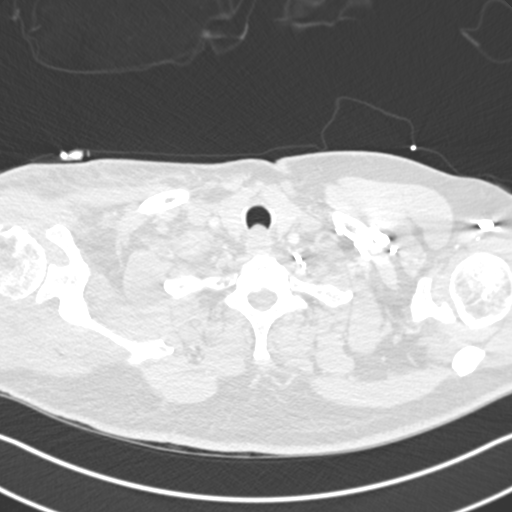

[Series 8: coronal mpr · coronal · 0.54mm/px · 3 of 88 slices shown]
[im 22/88  soft-tissue]
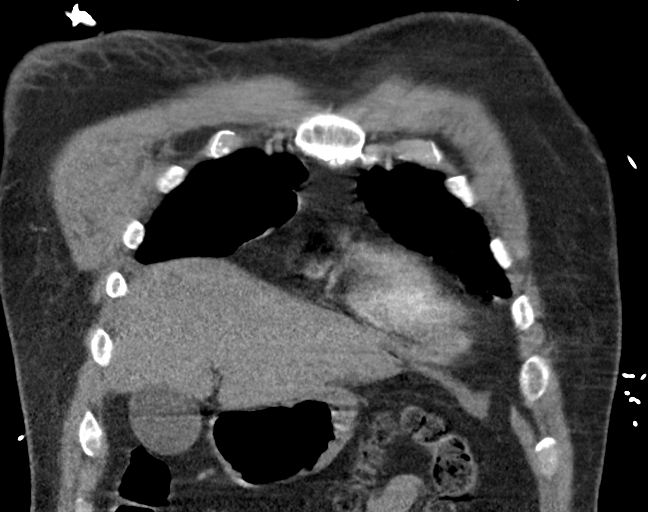
[im 44/88  soft-tissue]
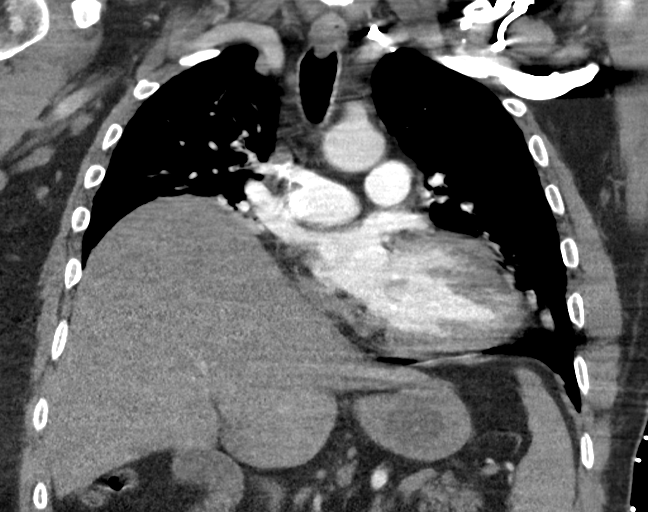
[im 66/88  soft-tissue]
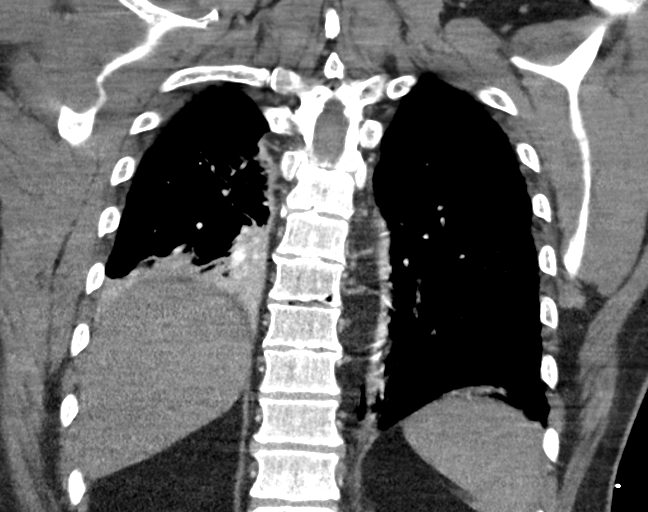

[18 of 46 positions shown; findings below may reference images not displayed]

FINDINGS: Cardiovascular: No filling defects in the pulmonary arteries to
suggest pulmonary emboli. Mild cardiomegaly. Aorta is normal
caliber.

Mediastinum/Nodes: No mediastinal, hilar, or axillary adenopathy.

Lungs/Pleura: Elevation of the right hemidiaphragm with right base
atelectasis. Minimal left base atelectasis. No effusions.

Upper Abdomen: Imaging into the upper abdomen shows no acute
findings.

Musculoskeletal: Chest wall soft tissues are unremarkable. Gas noted
within the soft tissues in the right upper extremity, presumably
related to recent shoulder surgery. No acute bony abnormality.

Review of the MIP images confirms the above findings.
IMPRESSION: No evidence of pulmonary embolus.

Elevation of the right hemidiaphragm with right base atelectasis.
Minimal left base atelectasis.

Mild cardiomegaly.

## 2020-11-02 ENCOUNTER — Other Ambulatory Visit: Payer: Self-pay | Admitting: Internal Medicine

## 2020-11-02 MED ORDER — NIRMATRELVIR/RITONAVIR (PAXLOVID)TABLET: ORAL_TABLET | ORAL | 0 refills | Status: AC

## 2023-10-31 ENCOUNTER — Ambulatory Visit: Admission: EM | Admit: 2023-10-31 | Discharge: 2023-10-31 | Disposition: A | Discharge status: Home / Self Care

## 2023-10-31 ENCOUNTER — Encounter: Payer: Self-pay | Admitting: *Deleted

## 2023-10-31 DIAGNOSIS — S0993XA Unspecified injury of face, initial encounter: Secondary | ICD-10-CM

## 2023-10-31 MED ORDER — CEPHALEXIN 500 MG PO CAPS: 500.0000 mg | ORAL_CAPSULE | Freq: Three times a day (TID) | ORAL | 0 refills | Status: AC

## 2023-10-31 NOTE — ED Provider Notes (Signed)
 Roberto Christian    CSN: [id number] Arrival date & time: 10/31/23  1830      History   Chief Complaint Chief Complaint  Patient presents with   Mouth Injury    HPI Roberto Christian is a 73 y.o. male.   Patient presents for evaluation of injury to the upper lip beginning 5 days ago after fall.  Endorses that he he tripped from a standing height hitting his head and face on the door.  First evaluation.  Denies dizziness, headache, lightheadedness, visual changes, loss of consciousness.  Has not attempted to clean wound.  Has not attempted any treatment.  Concern for infection.  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/06/2015   Erectile dysfunction 07/06/2015   Nocturia 07/06/2015   Obesity, Class I, BMI 30-34.9 11/26/2014   Other sleep apnea 11/26/2014   Psoriasis 11/26/2014   Anxiety state 11/20/2013   Hypotestosteronism 11/20/2013    Past Surgical History:  Procedure Laterality Date   ADENOIDECTOMY     BICEPT TENODESIS Right 01/05/2018   Procedure: BICEPS TENODESIS;  Surgeon: Lorri Rota, MD;  Location: [facility];  Service: Orthopedics;  Laterality: Right;   NOSE SURGERY     SHOULDER ARTHROSCOPY WITH OPEN ROTATOR CUFF REPAIR Right 01/05/2018   Procedure: SHOULDER ARTHROSCOPY WITH OPEN ROTATOR CUFF REPAIR DISTAL CLAVICLE EXCISION;  Surgeon: Lorri Rota, MD;  Location: [facility];  Service: Orthopedics;  Laterality: Right;       Home Medications    Prior to Admission medications   Medication Sig Start Date End Date Taking?  Authorizing Provider  amLODipine (NORVASC) 2.5 MG tablet TAKE 2 TABLETS (5 MG TOTAL) BY MOUTH ONCE DAILY 08/24/23  Yes [provider]  Aspirin  81 MG CAPS Take 81 mg by mouth. 01/05/18  Yes [provider]  atorvastatin (LIPITOR) 10 MG tablet Take 1 tablet by mouth daily. 05/29/23  Yes [provider]  cephALEXin  (KEFLEX ) 500 MG capsule Take 1 capsule (500 mg total) by mouth 3 (three) times daily for 5 days. 10/31/23 11/05/23 Yes Brewer Hitchman R, NP  OZEMPIC, 2 MG/DOSE, 8 MG/3ML SOPN INJECT 0.75 MLS (2 MG TOTAL) SUBCUTANEOUSLY EVERY 7 (SEVEN) DAYS 07/31/23  Yes [provider]  lisinopril (PRINIVIL,ZESTRIL) 20 MG tablet Take by mouth. 02/16/18 02/16/19  [provider]  Melatonin 5 MG TABS Take 5 mg by mouth at bedtime as needed (sleep).    [provider]  metFORMIN (GLUCOPHAGE-XR) 500 MG 24 hr tablet Take 500 mg by mouth 2 (two) times daily.  11/10/17 11/10/18  [provider]  nirmatrelvir /ritonavir  EUA (PAXLOVID ) TABS Patient GFR is > 60. nirmatrelvir  (150 mg) 2 tabs PO BID for 5 days and ritonavir  (100 mg) 1 tab PO BID for 5 days. 11/02/20   Carollynn Cirri, NP  ondansetron  (ZOFRAN  ODT) 4 MG disintegrating tablet Take 1 tablet (4 mg total) by mouth every 8 (eight) hours as needed for nausea or vomiting. 01/05/18   Lorri Rota, MD  [facility] VERIO test strip USE  3 (THREE) TIMES DAILY USE AS INSTRUCTED E11.9 02/23/18   [provider]  sertraline (ZOLOFT) 50 MG tablet Take by mouth. 02/16/18 02/16/19  [provider]  sildenafil  (REVATIO ) 20 MG tablet 1-5 tabs 1 hour prior to intercourse 09/27/17   Stoioff, Kizzie Perks, MD  testosterone  cypionate (DEPOTESTOSTERONE CYPIONATE) 200 MG/ML injection Inject 1 mL (200 mg total) into the muscle every 14 (fourteen) days. 09/28/17   Stoioff, Scott C, MD  traMADol (ULTRAM) 50 MG tablet TAKE 1 TABLET BY MOUTH EVERY 8 HOURS AS NEEDED FOR PAIN 03/01/18   [provider]  triamcinolone cream  (KENALOG) 0.1 % Apply 1 application topically daily as needed (psoriasis).     [provider]    Family History Family History  Problem Relation Age of Onset   Cancer Mother 38   Heart attack Father 25   Diabetes Maternal Grandmother    Bladder Cancer Neg Hx    Kidney cancer Neg Hx     Social History Social History   Tobacco Use   Smoking status: Former    Current packs/day: 0.00    Average packs/day: 1 pack/day for 20.0 years (20.0 ttl pk-yrs)    Types: Cigarettes    Start date: 06/13/1968    Quit date: 06/13/1988    Years since quitting: 35.4   Smokeless tobacco: Never  Vaping Use   Vaping status: Never Used  Substance Use Topics   Alcohol use: Yes    Alcohol/week: 4.0 standard drinks of alcohol    Types: 2 Cans of beer, 2 Shots of liquor per week   Drug use: Never     Allergies   Patient has no known allergies.   Review of Systems Review of Systems   Physical Exam Triage Vital Signs ED Triage Vitals  Encounter Vitals Group     BP 10/31/23 1850 106/68     Systolic BP Percentile --      Diastolic BP Percentile --      Pulse Rate 10/31/23 1850 79     Resp 10/31/23 1850 20     Temp 10/31/23 1850 98.1 F (36.7 C)     Temp Source 10/31/23 1850 Oral     SpO2 10/31/23 1850 94 %     Weight 10/31/23 1846 164 lb (74.4 kg)     Height 10/31/23 1846 5\' 6"  (1.676 m)     Head Circumference --      Peak Flow --      Pain Score 10/31/23 1846 6     Pain Loc --      Pain Education --      Exclude from Growth Chart --    No data found.  Updated Vital Signs BP 106/68 (BP Location: Right Arm)   Pulse 79   Temp 98.1 F (36.7 C) (Oral)   Resp 20   Ht 5\' 6"  (1.676 m)   Wt 164 lb (74.4 kg)   SpO2 94%   BMI 26.47 kg/m   Visual Acuity Right Eye Distance:   Left Eye Distance:   Bilateral Distance:    Right Eye Near:   Left Eye Near:    Bilateral Near:     Physical Exam Constitutional:      Appearance: Normal appearance.  HENT:     Mouth/Throat:      Comments: Defer to photo  Eyes:     Extraocular Movements: Extraocular movements intact.  Pulmonary:     Effort: Pulmonary effort is normal.  Neurological:  Mental Status: He is alert and oriented to person, place, and time.      UC Treatments / Results  Labs (all labs ordered are listed, but only abnormal results are displayed) Labs Reviewed - No data to display  EKG   Radiology No results found.  Procedures Procedures (including critical care time)  Medications Ordered in UC Medications - No data to display  Initial Impression / Assessment and Plan / UC Course  I have reviewed the triage vital signs and the nursing notes.  Pertinent labs & imaging results that were available during my care of the patient were reviewed by me and considered in my medical decision making (see chart for details).  Mouth injury  Presentation consistent with infection, no drainage at this time, prescribed cephalexin , wound cleansed with chlorhexidine  and flushed with saline, advised daily cleansing at home, recommended supportive care and advised follow-up for delayed healing Final Clinical Impressions(s) / UC Diagnoses   Final diagnoses:  Mouth injury, initial encounter     Discharge Instructions      Today you were evaluated for your mouth injury, do have concerns for infection due to the delayed hingedly and continued pain  Take cephalexin  every 8 hours for the next 5 days to get rid of any germs  Wound has been cleansed here in the clinic, cleanse with unscented water and soap daily until healed  You may take Tylenol  as needed for pain  For any concerns regarding healing you may follow-up for reevaluation   ED Prescriptions     Medication Sig Dispense Auth. Provider   cephALEXin  (KEFLEX ) 500 MG capsule Take 1 capsule (500 mg total) by mouth 3 (three) times daily for 5 days. 15 capsule Kalyna Paolella R, NP      PDMP not reviewed this encounter.   Reena Canning, [place] 11/03/23 [phone]

## 2023-10-31 NOTE — ED Triage Notes (Signed)
 Patient states 4 days ago he fell from standing height hitting head/face on door, not seen at time of injury, complains of mouth pain, injury to inner upper lip that he thinks is infected.

## 2023-10-31 NOTE — Discharge Instructions (Addendum)
 Today you were evaluated for your mouth injury, do have concerns for infection due to the delayed hingedly and continued pain  Take cephalexin  every 8 hours for the next 5 days to get rid of any germs  Wound has been cleansed here in the clinic, cleanse with unscented water and soap daily until healed  You may take Tylenol  as needed for pain  For any concerns regarding healing you may follow-up for reevaluation

## 2024-06-19 ENCOUNTER — Ambulatory Visit

## 2024-06-19 DIAGNOSIS — L309 Dermatitis, unspecified: Secondary | ICD-10-CM

## 2024-06-19 DIAGNOSIS — L219 Seborrheic dermatitis, unspecified: Secondary | ICD-10-CM

## 2024-06-19 DIAGNOSIS — L308 Other specified dermatitis: Secondary | ICD-10-CM

## 2024-06-19 MED ORDER — CLOBETASOL PROPIONATE 0.05 % EX OINT: TOPICAL_OINTMENT | CUTANEOUS | 5 refills | Status: AC

## 2024-06-19 MED ORDER — KETOCONAZOLE 2 % EX SHAM: MEDICATED_SHAMPOO | CUTANEOUS | 5 refills | Status: AC

## 2024-06-19 MED ORDER — FLUOCINOLONE ACETONIDE BODY 0.01 % EX OIL: TOPICAL_OIL | CUTANEOUS | 5 refills | Status: AC

## 2024-06-19 MED ORDER — KETOCONAZOLE 2 % EX CREA: TOPICAL_CREAM | CUTANEOUS | 5 refills | Status: AC

## 2024-06-19 NOTE — Progress Notes (Signed)
" °  °  Subjective   Roberto Christian is a 74 y.o. male who presents for the following: Rash. Patient is new patient  Today patient reports: Hx o psoriasis worse on the hands, ears, scalp. Has used Triamcinolone in the past w/good results.   Rash in scalp and ears   Review of Systems:    No other skin or systemic complaints except as noted in HPI or Assessment and Plan.  The following portions of the chart were reviewed this encounter and updated as appropriate: medications, allergies, medical history  Relevant Medical History:  n/a   Objective  (SKPE) Well appearing patient in no apparent distress; mood and affect are within normal limits. Examination was performed of the: Focused Exam of: Bilateral hands   Examination notable for: Seborrheic Dermatitis: Erythema and scaling of the scalp, ear canals, and central face > rest of face. 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 Condition is symptomatic and bothersome to patient. Patient is flaring and not currently at treatment goal.  - Diagnosis, treatment options, prognosis, risk/ benefit, and side effects of treatment were discussed with the patient.  - Start clobetasol  0.05% ointment BID to raised itchy areas until areas are smooth, discussed risks/benefits of meds - Moisturization with with thick ointment like vaseline, use cotton gloves to sleep in at night - Wash hands in lukewarm water - Use cotton gloves under rubber gloves when using cleaning products - Recommend vaseline for hands and clobetasol  only  Seborrheic dermatitis/sebopsoriasis  Chronic and persistent condition with duration or expected duration over one year. Condition is symptomatic and bothersome to patient. Patient is flaring and  not currently at treatment goal.  - Discussed diagnosis, typical course, and treatment options for this condition - Explained to the patient the chronic nature of this diagnosis - Start ketoconazole  2% shampoo TIW, apply to scalp and affected areas of face/body and allow the shampoo to sit for 5-10 minutes before rinsing off - Consider alternating with use of Head and Shoulders or Selsun Blue anti-dandruff shampoo which contains zinc pyrithione 1%  Start fluocinolone  oil 0.01% nightly, and then wearing a shower cap to bed to let oil sit on scalp. Educated on risks of high potency topical steroids - Start ketoconazole  cream bid to affected areas of the ears     Was sun protection counseling provided?: No   Level of service outlined above   Patient instructions (SKPI)   Procedures, orders, diagnosis for this visit:    There are no diagnoses linked to this encounter.  Return to clinic: Return if symptoms worsen or fail to improve.  I, Emerick Ege, CMA am acting as scribe for Lauraine JAYSON Kanaris, MD.   Documentation: I have reviewed the above documentation for accuracy and completeness, and I agree with the above.  Lauraine JAYSON Kanaris, MD  "

## 2024-06-19 NOTE — Patient Instructions (Signed)
 Hand Hygiene Recommendations - Avoid overwashing your hands with harsh soap and water. Try to use Dove, Oil of Olay, or Cerave soap if you can, and after each handwashing, try to moisturize with something greasy. Recommendations include: 1. Neutrogena Norwegian Formula Hand cream 2. Eucerin Hand cream 3. Vaseline or Aquaphor - At night, after applying the moisturizer, put on cotton gloves (available at most pharmacies, 2 for $5). - Avoid using gel hand sanitizer if possible - Avoid hand contact with raw fruits as much as possible - Use cotton gloves under rubber gloves for all wetwork - Avoid excessive manipulation of cuticles - do not overcut.  Due to recent changes in healthcare laws, you may see results of your pathology and/or laboratory studies on MyChart before the doctors have had a chance to review them. We understand that in some cases there may be results that are confusing or concerning to you. Please understand that not all results are received at the same time and often the doctors may need to interpret multiple results in order to provide you with the best plan of care or course of treatment. Therefore, we ask that you please give us  2 business days to thoroughly review all your results before contacting the office for clarification. Should we see a critical lab result, you will be contacted sooner.   If You Need Anything After Your Visit  If you have any questions or concerns for your doctor, please call our main line at [phone] and press option 4 to reach your doctor's medical assistant. If no one answers, please leave a voicemail as directed and we will return your call as soon as possible. Messages left after 4 pm will be answered the following business day.   You may also send us  a message via MyChart. We typically respond to MyChart messages within 1-2 business days.  For prescription refills, please ask your pharmacy to contact our office. Our fax number is  [phone].  If you have an urgent issue when the clinic is closed that cannot wait until the next business day, you can page your doctor at the number below.    Please note that while we do our best to be available for urgent issues outside of office hours, we are not available 24/7.   If you have an urgent issue and are unable to reach us , you may choose to seek medical care at your doctor's office, retail clinic, urgent care center, or emergency room.  If you have a medical emergency, please immediately call 911 or go to the emergency department.  Pager Numbers  - Dr. Hester: [phone]  - Dr. Jackquline: [phone]  - Dr. Claudene: [phone]   - Dr. Raymund: [phone]  In the event of inclement weather, please call our main line at [phone] for an update on the status of any delays or closures.  Dermatology Medication Tips: Please keep the boxes that topical medications come in in order to help keep track of the instructions about where and how to use these. Pharmacies typically print the medication instructions only on the boxes and not directly on the medication tubes.   If your medication is too expensive, please contact our office at [phone] option 4 or send us  a message through MyChart.   We are unable to tell what your co-pay for medications will be in advance as this is different depending on your insurance coverage. However, we may be able to find a substitute medication at lower cost or fill out  paperwork to get insurance to cover a needed medication.   If a prior authorization is required to get your medication covered by your insurance company, please allow us  1-2 business days to complete this process.  Drug prices often vary depending on where the prescription is filled and some pharmacies may offer cheaper prices.  The website www.goodrx.com contains coupons for medications through different pharmacies. The prices here do not account for what the cost  may be with help from insurance (it may be cheaper with your insurance), but the website can give you the price if you did not use any insurance.  - You can print the associated coupon and take it with your prescription to the pharmacy.  - You may also stop by our office during regular business hours and pick up a GoodRx coupon card.  - If you need your prescription sent electronically to a different pharmacy, notify our office through [facility] or by phone at [phone] option 4.     Si Usted Necesita Algo Despus de Su Visita  Tambin puede enviarnos un mensaje a travs de Clinical Cytogeneticist. Por lo general respondemos a los mensajes de MyChart en el transcurso de 1 a 2 das hbiles.  Para renovar recetas, por favor pida a su farmacia que se ponga en contacto con nuestra oficina. Randi lakes de fax es [place] [phone].  Si tiene un asunto urgente cuando la clnica est cerrada y que no puede esperar hasta el siguiente da hbil, puede llamar/localizar a su doctor(a) al nmero que aparece a continuacin.   Por favor, tenga en cuenta que aunque hacemos todo lo posible para estar disponibles para asuntos urgentes fuera del horario de [place], no estamos disponibles las 24 horas del da, los 7 [address]  [address] de la [place].   Si tiene un problema urgente y no puede comunicarse con nosotros, puede optar por buscar atencin mdica  en el consultorio de su doctor(a), en una clnica privada, en un centro de atencin urgente o en una sala de emergencias.  Si tiene engineer, drilling, por favor llame inmediatamente al 911 o vaya a la sala de emergencias.  Nmeros de bper  - Dr. Hester: [phone]  - Dra. Jackquline: [phone]  - Dr. Claudene: [phone]  - Dra. Kitts: [phone]  En caso de inclemencias del [place], por favor llame a nuestra lnea principal al [phone] para una actualizacin sobre el estado de cualquier retraso o cierre.  Consejos para la medicacin en dermatologa: Por  favor, guarde las cajas en las que vienen los medicamentos de uso tpico para ayudarle a seguir las instrucciones sobre dnde y cmo usarlos. Las farmacias generalmente imprimen las instrucciones del medicamento slo en las cajas y no directamente en los tubos del [place].   Si su medicamento es muy caro, por favor, pngase en contacto con landry rieger llamando al [phone] y presione la opcin 4 o envenos un mensaje a travs de Clinical Cytogeneticist.   No podemos decirle cul ser su copago por los medicamentos por adelantado ya que esto es diferente dependiendo de la cobertura de su seguro. Sin embargo, es posible que podamos encontrar un medicamento sustituto a audiological scientist un formulario para que el seguro cubra el medicamento que se considera necesario.   Si se requiere una autorizacin previa para que su compaa de seguros cubra su medicamento, por favor permtanos de 1 a 2 das hbiles para completar este proceso.  Los precios de los medicamentos varan con frecuencia dependiendo del environmental consultant de dnde se  surte la receta y alguna farmacias pueden ofrecer precios ms baratos.  El sitio web www.goodrx.com tiene cupones para medicamentos de health and safety inspector. Los precios aqu no tienen en cuenta lo que podra costar con la ayuda del seguro (puede ser ms barato con su seguro), pero el sitio web puede darle el precio si no utiliz tourist information centre manager.  - Puede imprimir el cupn correspondiente y llevarlo con su receta a la farmacia.  - Tambin puede pasar por nuestra oficina durante el horario de atencin regular y education officer, museum una tarjeta de cupones de GoodRx.  - Si necesita que su receta se enve electrnicamente a una farmacia diferente, informe a nuestra oficina a travs de MyChart de [place] o por telfono llamando al [phone] y presione la opcin 4.
